# Patient Record
Sex: Male | Born: 1960 | Race: Black or African American | Hispanic: No | Marital: Married | Smoking: Former smoker
Health system: Southern US, Community
[De-identification: ages and names within clinical notes are randomized; demographics above are authoritative.]

## PROBLEM LIST (undated history)

## (undated) DIAGNOSIS — K219 Gastro-esophageal reflux disease without esophagitis: Secondary | ICD-10-CM

## (undated) HISTORY — PX: HERNIA REPAIR: SHX51

---

## 2004-09-26 ENCOUNTER — Emergency Department (HOSPITAL_COMMUNITY): Admission: EM | Admit: 2004-09-26 | Discharge: 2004-09-26 | Payer: Self-pay | Admitting: Emergency Medicine

## 2009-09-29 ENCOUNTER — Emergency Department (HOSPITAL_COMMUNITY): Admission: EM | Admit: 2009-09-29 | Discharge: 2009-09-29 | Payer: Self-pay | Admitting: Emergency Medicine

## 2011-01-26 ENCOUNTER — Ambulatory Visit (HOSPITAL_COMMUNITY)
Admission: RE | Admit: 2011-01-26 | Discharge: 2011-01-26 | Disposition: A | Discharge status: Home / Self Care | Payer: Managed Care, Other (non HMO) | Source: Ambulatory Visit | Attending: Family Medicine | Admitting: Family Medicine

## 2011-01-26 ENCOUNTER — Other Ambulatory Visit (HOSPITAL_COMMUNITY): Payer: Self-pay | Admitting: *Deleted

## 2011-01-26 DIAGNOSIS — F17200 Nicotine dependence, unspecified, uncomplicated: Secondary | ICD-10-CM | POA: Insufficient documentation

## 2011-01-26 DIAGNOSIS — R590 Localized enlarged lymph nodes: Secondary | ICD-10-CM

## 2011-01-26 DIAGNOSIS — R599 Enlarged lymph nodes, unspecified: Secondary | ICD-10-CM | POA: Insufficient documentation

## 2011-03-02 LAB — URINE MICROSCOPIC-ADD ON

## 2011-03-02 LAB — URINALYSIS, ROUTINE W REFLEX MICROSCOPIC
Bilirubin Urine: NEGATIVE
Glucose, UA: NEGATIVE mg/dL
Leukocytes, UA: NEGATIVE
Nitrite: NEGATIVE
Protein, ur: NEGATIVE mg/dL
Specific Gravity, Urine: 1.025 (ref 1.005–1.030)
Urobilinogen, UA: 1 mg/dL (ref 0.0–1.0)
pH: 6 (ref 5.0–8.0)

## 2011-03-02 LAB — GC/CHLAMYDIA PROBE AMP, GENITAL
Chlamydia, DNA Probe: NEGATIVE
GC Probe Amp, Genital: NEGATIVE

## 2011-06-26 IMAGING — CR DG CHEST 2V
2 series · 2 of 2 positions shown · non-contrast
Comparison: None

CLINICAL DATA: Inguinal adenopathy, smoker

CHEST - 2 VIEW

[view not recorded (1 of 2)]
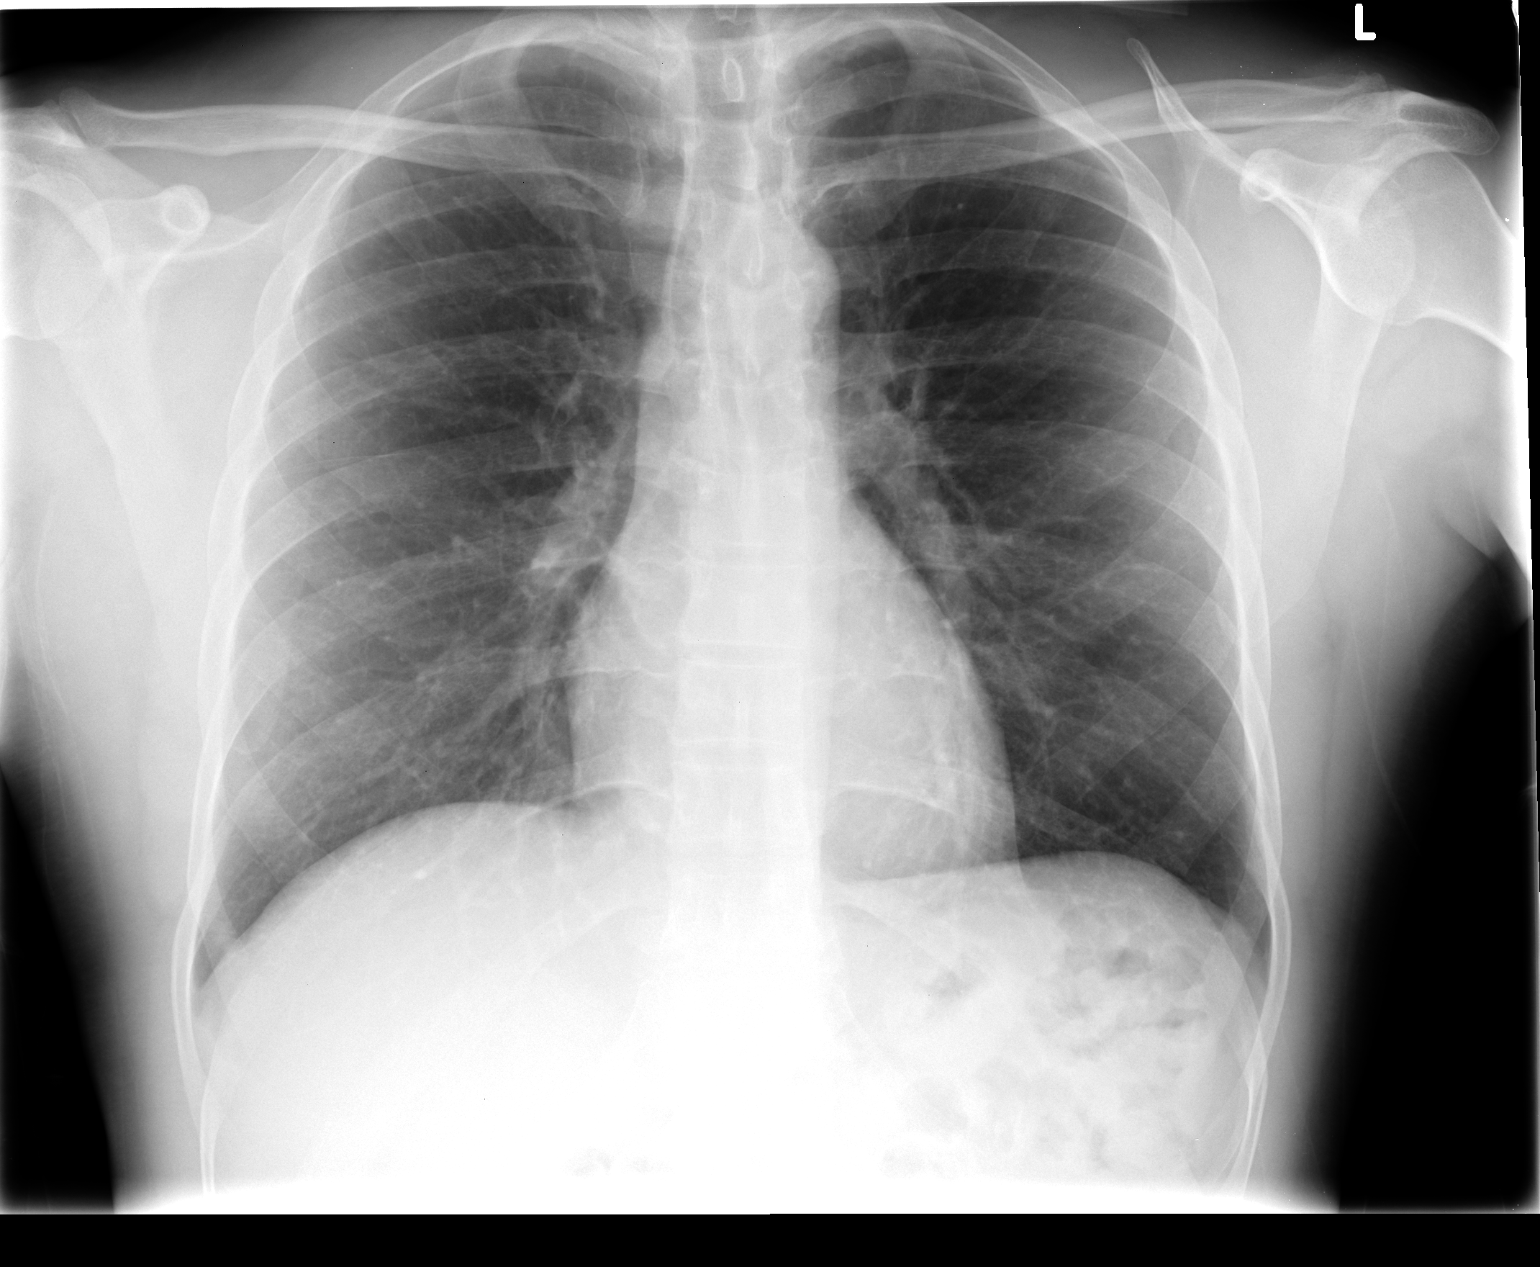

[view not recorded (2 of 2)]
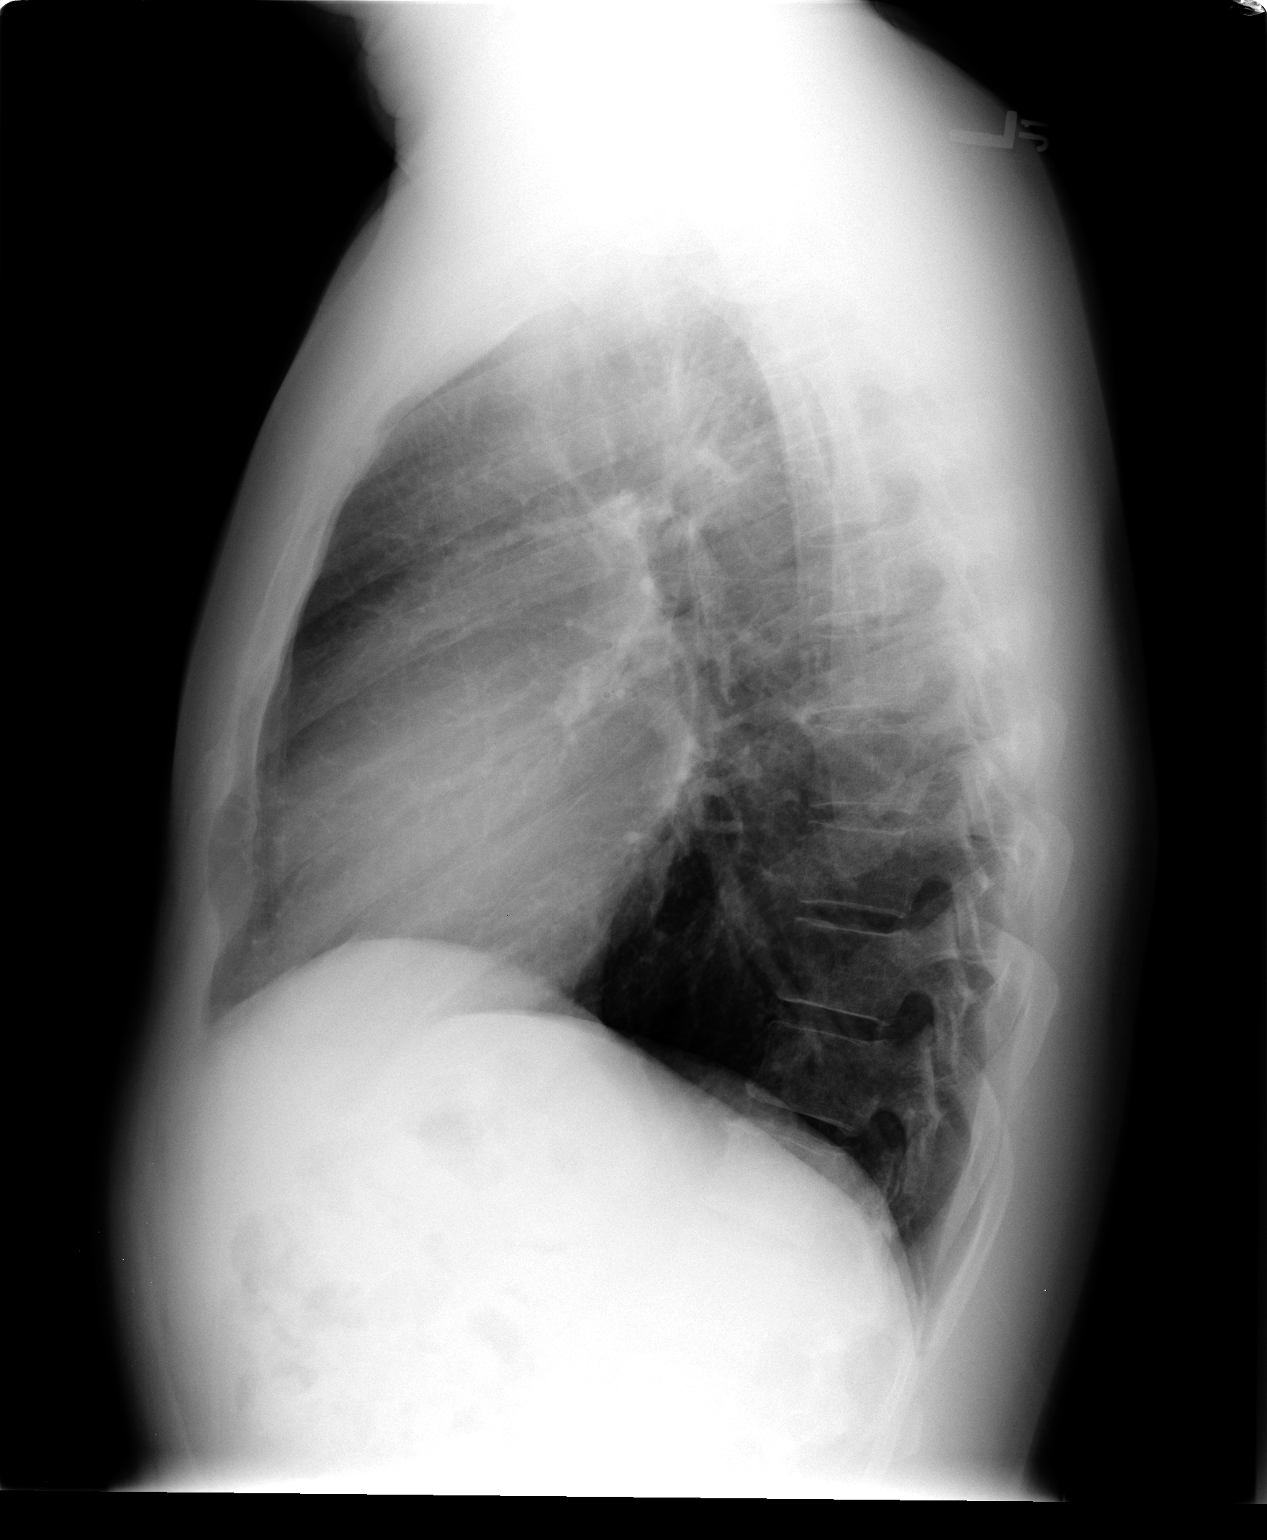

[2 of 2 positions shown; findings below may reference images not displayed]

FINDINGS: Normal heart size, mediastinal contours, and pulmonary vascularity.
Mild peribronchial thickening.
No pulmonary infiltrate, pleural effusion, or pneumothorax.
Bones unremarkable.
IMPRESSION: Mild bronchitic changes.

## 2011-07-14 ENCOUNTER — Ambulatory Visit: Payer: Managed Care, Other (non HMO) | Admitting: Family Medicine

## 2011-08-09 ENCOUNTER — Ambulatory Visit: Payer: Managed Care, Other (non HMO) | Admitting: Family Medicine

## 2011-09-05 ENCOUNTER — Ambulatory Visit (INDEPENDENT_AMBULATORY_CARE_PROVIDER_SITE_OTHER): Payer: Managed Care, Other (non HMO) | Admitting: Family Medicine

## 2011-09-05 ENCOUNTER — Encounter: Payer: Self-pay | Admitting: Family Medicine

## 2011-09-05 VITALS — BP 110/82 | HR 79 | Resp 16 | Ht 67.0 in | Wt 142.1 lb

## 2011-09-05 DIAGNOSIS — Z13220 Encounter for screening for lipoid disorders: Secondary | ICD-10-CM

## 2011-09-05 DIAGNOSIS — J069 Acute upper respiratory infection, unspecified: Secondary | ICD-10-CM

## 2011-09-05 DIAGNOSIS — Z0000 Encounter for general adult medical examination without abnormal findings: Secondary | ICD-10-CM

## 2011-09-05 DIAGNOSIS — R062 Wheezing: Secondary | ICD-10-CM

## 2011-09-05 DIAGNOSIS — Z23 Encounter for immunization: Secondary | ICD-10-CM

## 2011-09-05 DIAGNOSIS — Z125 Encounter for screening for malignant neoplasm of prostate: Secondary | ICD-10-CM

## 2011-09-05 DIAGNOSIS — Z833 Family history of diabetes mellitus: Secondary | ICD-10-CM | POA: Insufficient documentation

## 2011-09-05 LAB — POC HEMOCCULT BLD/STL (OFFICE/1-CARD/DIAGNOSTIC): Fecal Occult Blood, POC: NEGATIVE

## 2011-09-05 MED ORDER — ALBUTEROL 90 MCG/ACT IN AERS: 2.0000 | INHALATION_SPRAY | RESPIRATORY_TRACT | Status: DC | PRN

## 2011-09-05 NOTE — Assessment & Plan Note (Signed)
Viral in etiology, normal exam. Given albuterol for prn wheeze, since he was a previous smoker, obtain CXR

## 2011-09-05 NOTE — Progress Notes (Signed)
  Subjective:    Patient ID: Brandon Davies, male    DOB: 1961/05/08, 50 y.o.   MRN: [id number]  HPI - patient here to establish care and CPE, medications and history were reviewed.    Here for CPE  History of cystocopy for hematuria- 2010, negative outcome Had Colonscopy > 10 years ago, for blood in stool- negative scan, told he had gastritis and Reflux at that time, no problem since  Has enlarged sweat glands, usually occurs in summer time- I called pharmacy he was last prescribed Cleocin topical some time ago Last blood work- 1 year ago, from a physical   Tries to exercise some    Recent URI- still has cough, has wheezing at bedtime, previous smoker quit a few years ago, states when he gets a cold about once a year has some wheezing with this. He has never been evaluated for it.  No history of HTN, DM, Cancer  Review of Systems GEN- denies fatigue, fever, weight loss,weakness, recent illness HEENT- denies eye drainage, change in vision, nasal discharge, CVS- denies chest pain, palpitations RESP- denies SOB, +cough, +wheeze ABD- denies N/V, change in stools, abd pain GU- denies dysuria, hematuria, dribbling, incontinence MSK- denies joint pain, muscle aches, injury Neuro- denies headache, dizziness, syncope, seizure activity           Objective:   Physical Exam  GEN- NAD, alert and oriented x3 HEENT- PERRL, EOMI, non injected sclera, pink conjunctiva, MMM, oropharynx clear    TM clear bilat Neck- Supple, no thryomegaly CVS- RRR, no murmur RESP-CTAB, harsh cough, no retractions, no wheeze ABD-NABS, soft, NT, ND EXT- No edema Pulses- Radial, DP- 2+ GU- normal prostate size, normal rectal tone,no igunial hernia, no penile lesions Lymph- small mobile node in left groin      FOBT-neg Assessment & Plan:

## 2011-09-05 NOTE — Patient Instructions (Addendum)
I recommend a daily multivitamin Continue your exercise routine Please get the Chest x-ray for your wheezing Use the albuterol as needed every 4 hours We will call with lab results Please come fasting before labs- do not eat after midnight I recommend yearly eye visit I recommend dental visit every 6 months  F/U 4 months

## 2011-09-05 NOTE — Assessment & Plan Note (Signed)
CPE done, will check FLP, CMET, A1C with family history Encourage exercise See instructions

## 2011-09-05 NOTE — Assessment & Plan Note (Signed)
CXR, will hold on PFT unless pt has persistent wheezing episodes or has difficulty during the winter months

## 2011-09-19 LAB — COMPREHENSIVE METABOLIC PANEL
ALT: 13 U/L (ref 0–53)
AST: 17 U/L (ref 0–37)
Albumin: 4.6 g/dL (ref 3.5–5.2)
Alkaline Phosphatase: 47 U/L (ref 39–117)
BUN: 16 mg/dL (ref 6–23)
CO2: 24 mEq/L (ref 19–32)
Calcium: 9.3 mg/dL (ref 8.4–10.5)
Chloride: 105 mEq/L (ref 96–112)
Creat: 0.71 mg/dL (ref 0.50–1.35)
Glucose, Bld: 88 mg/dL (ref 70–99)
Potassium: 4.2 mEq/L (ref 3.5–5.3)
Sodium: 141 mEq/L (ref 135–145)
Total Bilirubin: 0.5 mg/dL (ref 0.3–1.2)
Total Protein: 6.8 g/dL (ref 6.0–8.3)

## 2011-09-19 LAB — PSA: PSA: 1.93 ng/mL (ref <=4.00)

## 2011-09-19 LAB — LIPID PANEL
Cholesterol: 160 mg/dL (ref 0–200)
HDL: 38 mg/dL — ABNORMAL LOW (ref >39)
LDL Cholesterol: 86 mg/dL (ref 0–99)
Total CHOL/HDL Ratio: 4.2 Ratio
Triglycerides: 179 mg/dL — ABNORMAL HIGH (ref <150)
VLDL: 36 mg/dL (ref 0–40)

## 2011-09-19 LAB — HEMOGLOBIN A1C
Hgb A1c MFr Bld: 5.7 % — ABNORMAL HIGH (ref <5.7)
Mean Plasma Glucose: 117 mg/dL — ABNORMAL HIGH (ref <117)

## 2011-09-20 ENCOUNTER — Telehealth: Payer: Self-pay | Admitting: *Deleted

## 2011-09-20 LAB — CBC WITH DIFFERENTIAL/PLATELET
Basophils Absolute: 0 10*3/uL (ref 0.0–0.1)
Basophils Relative: 0 % (ref 0–1)
Eosinophils Absolute: 0.1 10*3/uL (ref 0.0–0.7)
Eosinophils Relative: 2 % (ref 0–5)
HCT: 38.4 % — ABNORMAL LOW (ref 39.0–52.0)
Hemoglobin: 13.1 g/dL (ref 13.0–17.0)
Lymphocytes Relative: 30 % (ref 12–46)
Lymphs Abs: 2.5 10*3/uL (ref 0.7–4.0)
MCH: 32.9 pg (ref 26.0–34.0)
MCHC: 34.1 g/dL (ref 30.0–36.0)
MCV: 96.5 fL (ref 78.0–100.0)
Monocytes Absolute: 0.4 10*3/uL (ref 0.1–1.0)
Monocytes Relative: 5 % (ref 3–12)
Neutro Abs: 5.2 10*3/uL (ref 1.7–7.7)
Neutrophils Relative %: 63 % (ref 43–77)
Platelets: 198 10*3/uL (ref 150–400)
RBC: 3.98 MIL/uL — ABNORMAL LOW (ref 4.22–5.81)
RDW: 13.4 % (ref 11.5–15.5)
WBC: 8.2 10*3/uL (ref 4.0–10.5)

## 2011-09-20 NOTE — Telephone Encounter (Signed)
Message copied by Diamantina Monks on Tue Sep 20, 2011  2:02 PM ------      Message from: Milinda Antis F      Created: Tue Sep 20, 2011  8:24 AM       Please give pt lab results      1. He is not anemic      2. His cholesterol looks okay, he needs to watch the fatty acids- they are a little high. This means watch the greasy, fried foods, try grilled or baked foods      3. He does not have diabetes      4. His prostate cancer screen was normal      5. His kidney and liver function were normal      6. I would still like for him to get the CXR            In general his labs look good.

## 2011-09-20 NOTE — Telephone Encounter (Signed)
Patient aware of lab results.

## 2011-09-20 NOTE — Progress Notes (Signed)
Patient aware.

## 2011-11-30 ENCOUNTER — Encounter: Payer: Self-pay | Admitting: Family Medicine

## 2011-11-30 NOTE — Progress Notes (Signed)
Patient ID: Brandon Davies, male   DOB: 10/20/1961, 51 y.o.   MRN: [id number] Pt seen in Feb 2012 with inguinal LAD on left side, history of node on right groin treated with antibiotics, labs from that time reviewed . GC/Chlamy/ HIV neg-- ANA/ESR neg  Labs to be scanned with FLP from 1 year ago

## 2012-01-06 ENCOUNTER — Ambulatory Visit: Payer: Managed Care, Other (non HMO) | Admitting: Family Medicine

## 2012-01-13 ENCOUNTER — Ambulatory Visit: Payer: Managed Care, Other (non HMO) | Admitting: Family Medicine

## 2012-09-21 ENCOUNTER — Encounter: Payer: Managed Care, Other (non HMO) | Admitting: Family Medicine

## 2012-12-11 ENCOUNTER — Encounter: Payer: Managed Care, Other (non HMO) | Admitting: Family Medicine

## 2012-12-28 ENCOUNTER — Ambulatory Visit (INDEPENDENT_AMBULATORY_CARE_PROVIDER_SITE_OTHER): Payer: Managed Care, Other (non HMO) | Admitting: Family Medicine

## 2012-12-28 ENCOUNTER — Encounter: Payer: Self-pay | Admitting: Family Medicine

## 2012-12-28 VITALS — BP 112/64 | HR 70 | Resp 18 | Ht 67.0 in | Wt 142.1 lb

## 2012-12-28 DIAGNOSIS — Z1211 Encounter for screening for malignant neoplasm of colon: Secondary | ICD-10-CM

## 2012-12-28 DIAGNOSIS — Z833 Family history of diabetes mellitus: Secondary | ICD-10-CM

## 2012-12-28 DIAGNOSIS — Z13220 Encounter for screening for lipoid disorders: Secondary | ICD-10-CM

## 2012-12-28 DIAGNOSIS — R062 Wheezing: Secondary | ICD-10-CM

## 2012-12-28 DIAGNOSIS — Z125 Encounter for screening for malignant neoplasm of prostate: Secondary | ICD-10-CM

## 2012-12-28 DIAGNOSIS — Z0000 Encounter for general adult medical examination without abnormal findings: Secondary | ICD-10-CM

## 2012-12-28 LAB — CBC
HCT: 39.6 % (ref 39.0–52.0)
Hemoglobin: 13.6 g/dL (ref 13.0–17.0)
MCH: 32.1 pg (ref 26.0–34.0)
MCHC: 34.3 g/dL (ref 30.0–36.0)
MCV: 93.4 fL (ref 78.0–100.0)
Platelets: 168 10*3/uL (ref 150–400)
RBC: 4.24 MIL/uL (ref 4.22–5.81)
RDW: 13.7 % (ref 11.5–15.5)
WBC: 8.9 10*3/uL (ref 4.0–10.5)

## 2012-12-28 LAB — POC HEMOCCULT BLD/STL (OFFICE/1-CARD/DIAGNOSTIC): Fecal Occult Blood, POC: NEGATIVE

## 2012-12-28 LAB — COMPREHENSIVE METABOLIC PANEL
ALT: 16 U/L (ref 0–53)
AST: 22 U/L (ref 0–37)
Albumin: 4.8 g/dL (ref 3.5–5.2)
Alkaline Phosphatase: 47 U/L (ref 39–117)
BUN: 18 mg/dL (ref 6–23)
CO2: 27 mEq/L (ref 19–32)
Calcium: 9.7 mg/dL (ref 8.4–10.5)
Chloride: 107 mEq/L (ref 96–112)
Creat: 0.8 mg/dL (ref 0.50–1.35)
Glucose, Bld: 84 mg/dL (ref 70–99)
Potassium: 4.2 mEq/L (ref 3.5–5.3)
Sodium: 141 mEq/L (ref 135–145)
Total Bilirubin: 0.5 mg/dL (ref 0.3–1.2)
Total Protein: 7 g/dL (ref 6.0–8.3)

## 2012-12-28 LAB — LIPID PANEL
Cholesterol: 175 mg/dL (ref 0–200)
HDL: 43 mg/dL (ref >39)
LDL Cholesterol: 114 mg/dL — ABNORMAL HIGH (ref 0–99)
Total CHOL/HDL Ratio: 4.1 Ratio
Triglycerides: 92 mg/dL (ref <150)
VLDL: 18 mg/dL (ref 0–40)

## 2012-12-28 LAB — PSA: PSA: 0.57 ng/mL (ref <=4.00)

## 2012-12-28 MED ORDER — ALBUTEROL SULFATE HFA 108 (90 BASE) MCG/ACT IN AERS: 2.0000 | INHALATION_SPRAY | Freq: Four times a day (QID) | RESPIRATORY_TRACT | Status: DC | PRN

## 2012-12-28 NOTE — Progress Notes (Signed)
  Subjective:    Patient ID: Brandon Davies, male    DOB: 1961/07/12, 52 y.o.   MRN: [id number]  HPI Pt here for CPE, due for flu shot, colonoscopy- last seen 2012 Due for fasting labs Continues to have wheezing episodes and coughing episodes, smokes 1-2 cig at a time but not daily. No productive cough   Review of Systems  GEN- denies fatigue, fever, weight loss,weakness, recent illness HEENT- denies eye drainage, change in vision, nasal discharge, CVS- denies chest pain, palpitations RESP- denies SOB, cough, +wheeze ABD- denies N/V, change in stools, abd pain GU- denies dysuria, hematuria, dribbling, incontinence MSK- denies joint pain, muscle aches, injury Neuro- denies headache, dizziness, syncope, seizure activity      Objective:   Physical Exam GEN- NAD, alert and oriented x3 HEENT- PERRL, EOMI, non injected sclera, pink conjunctiva, MMM, oropharynx clear, TM clear bilat  Neck- Supple, no thryomegaly CVS- RRR, no murmur RESP-CTAB ABD-NABS,soft,NT,neurodermatitis GU- rectum normal external skin, normal tone, only lower half of prostate felt during exam due to discomfort- no nodules , no enlargement Neuro- CNII-XII in tact, no focal deficits  EXT- No edema Pulses- Radial, DP- 2+        Assessment & Plan:    CPE- Refer for colonoscopy, fasting labs, immunizations UTD

## 2012-12-28 NOTE — Patient Instructions (Addendum)
I recommend eye visit once a year- Call [facility] Get flu shot from pharmacy Referral for colonoscopy Pulmonary Function test to be done due to wheezing I recommend dental visit every 6 months Goal is to  Exercise 30 minutes 5 days a week We will send a letter with lab results  Albuterol as needed for wheezing You need to quit smoking- all together! F/U1 year or as needed

## 2012-12-29 LAB — HEMOGLOBIN A1C
Hgb A1c MFr Bld: 5.8 % — ABNORMAL HIGH (ref <5.7)
Mean Plasma Glucose: 120 mg/dL — ABNORMAL HIGH (ref <117)

## 2012-12-30 ENCOUNTER — Encounter: Payer: Self-pay | Admitting: Family Medicine

## 2012-12-30 NOTE — Assessment & Plan Note (Addendum)
Refer for PFT, albuterol prn

## 2013-01-16 ENCOUNTER — Encounter (HOSPITAL_COMMUNITY): Payer: Self-pay | Admitting: Pharmacy Technician

## 2013-01-16 ENCOUNTER — Other Ambulatory Visit: Payer: Self-pay

## 2013-01-16 ENCOUNTER — Telehealth: Payer: Self-pay

## 2013-01-16 DIAGNOSIS — Z1211 Encounter for screening for malignant neoplasm of colon: Secondary | ICD-10-CM

## 2013-01-16 NOTE — Telephone Encounter (Signed)
Gastroenterology Pre-Procedure Form    Request Date: 01/25/2013    Requesting Physician: Dr. Jeanice Lim     PATIENT INFORMATION:  Brandon Davies is a 52 y.o., male (DOB=1961-06-19).  PROCEDURE: Procedure(s) requested: colonoscopy Procedure Reason: screening for colon cancer  PATIENT REVIEW QUESTIONS: The patient reports the following:   1. Diabetes Melitis: no 2. Joint replacements in the past 12 months: no 3. Major health problems in the past 3 months: no 4. Has an artificial valve or MVP:no 5. Has been advised in past to take antibiotics in advance of a procedure like teeth cleaning: no}    MEDICATIONS & ALLERGIES:    Patient reports the following regarding taking any blood thinners:   Plavix? no Aspirin?no Coumadin?  no  Patient confirms/reports the following medications:  Current Outpatient Prescriptions  Medication Sig Dispense Refill  . albuterol (PROVENTIL HFA;VENTOLIN HFA) 108 (90 BASE) MCG/ACT inhaler Inhale 2 puffs into the lungs every 6 (six) hours as needed for wheezing.  1 Inhaler  2   No current facility-administered medications for this visit.    Patient confirms/reports the following allergies:  Allergies  Allergen Reactions  . Penicillins     Patient is appropriate to schedule for requested procedure(s): yes  AUTHORIZATION INFORMATION Primary Insurance:   ID #: Group #:  Pre-Cert / Auth required:  Pre-Cert / Auth #:   Secondary Insurance:   ID #: Group #:  Pre-Cert / Auth required: Pre-Cert / Auth #:   No orders of the defined types were placed in this encounter.    SCHEDULE INFORMATION: Procedure has been scheduled as follows:  Date: 01/25/2013    Time: 8:30 AM  Location: [facility] Short Stay  This Gastroenterology Pre-Precedure Form is being routed to the following provider(s) for review: R. Roetta Sessions, MD

## 2013-01-16 NOTE — Telephone Encounter (Signed)
Appropriate for colonoscopy.  

## 2013-01-17 ENCOUNTER — Telehealth: Payer: Self-pay | Admitting: Family Medicine

## 2013-01-17 MED ORDER — PEG-KCL-NACL-NASULF-NA ASC-C 100 G PO SOLR: 1.0000 | ORAL | Status: DC

## 2013-01-17 NOTE — Telephone Encounter (Signed)
Rx sent to [facility] in [place]. Instructions mailed to pt.

## 2013-01-17 NOTE — Telephone Encounter (Signed)
Patient is aware 

## 2013-01-18 ENCOUNTER — Telehealth: Payer: Self-pay

## 2013-01-18 NOTE — Telephone Encounter (Signed)
I called [employer] at [phone]  And spoke to [facility], who said a PA is not required for screening colonoscopy.

## 2013-01-21 ENCOUNTER — Telehealth: Payer: Self-pay | Admitting: Internal Medicine

## 2013-01-21 NOTE — Telephone Encounter (Signed)
I called pt and rescheduled for 3/21/214 at 8:30. Selena Batten is aware and I have mailed new instructions to pt.

## 2013-01-21 NOTE — Telephone Encounter (Signed)
Pt called to cancel tcs for this Friday and wants to [facility] procedure for either 3/14 or 3/21 please call him at [phone] or he will call back before 5 today

## 2013-01-24 ENCOUNTER — Encounter (HOSPITAL_COMMUNITY): Payer: Managed Care, Other (non HMO)

## 2013-01-30 ENCOUNTER — Ambulatory Visit (HOSPITAL_COMMUNITY)
Admission: RE | Admit: 2013-01-30 | Discharge: 2013-01-30 | Disposition: A | Discharge status: Home / Self Care | Payer: Managed Care, Other (non HMO) | Source: Ambulatory Visit | Attending: Family Medicine | Admitting: Family Medicine

## 2013-01-30 DIAGNOSIS — R062 Wheezing: Secondary | ICD-10-CM | POA: Insufficient documentation

## 2013-02-05 LAB — PULMONARY FUNCTION TEST

## 2013-02-06 NOTE — Procedures (Signed)
NAME:  Brandon Davies, WHITERS                     ACCOUNT NO.:  MEDICAL RECORD NO.:  [id number]  LOCATION:                                 FACILITY:  PHYSICIAN:  Edward L. Juanetta Gosling, M.D.DATE OF BIRTH:  04-09-1961  DATE OF PROCEDURE: DATE OF DISCHARGE:                           PULMONARY FUNCTION TEST   REASON FOR PULMONARY FUNCTION TESTING:  Wheezing.  1. Spirometry is normal. 2. Lung volumes are normal. 3. DLCO is mildly reduced, but does correct with volume. 4. Airway resistance is somewhat high. 5. This study shows minimal if any airflow obstruction.  Wheezing could be related to asthma or due to exposure to inhalant toxins.  Please note that the diffusing capacity shows __________ error, which may indicate that diffusing capacity is better than indicating.     Edward L. Juanetta Gosling, M.D.     ELH/MEDQ  D:  02/04/2013  T:  02/05/2013  Job:  [id number]

## 2013-02-08 NOTE — Progress Notes (Signed)
Left message to let patient know to call back and schedule an appointment

## 2013-02-12 ENCOUNTER — Telehealth: Payer: Self-pay

## 2013-02-12 NOTE — Telephone Encounter (Signed)
We have not heard back from pt yet, so Selena Batten said she is taking completely off schedule and I will have to do new orders when he reschedules.

## 2013-02-12 NOTE — Telephone Encounter (Signed)
VM from someone for pt. Brandon Davies he would like to reschedule his appt for colonoscopy that is scheduled for 8:30 AM on 02/15/2013. I called at the number that was given, [phone] and left message that I have taken him off of the schedule and to please return call to reschedule. ( Will need to update triage also). Selena Batten is aware he is not coming on 02/15/2013.

## 2013-02-14 ENCOUNTER — Other Ambulatory Visit: Payer: Self-pay

## 2013-02-14 DIAGNOSIS — Z1211 Encounter for screening for malignant neoplasm of colon: Secondary | ICD-10-CM

## 2013-02-14 NOTE — Telephone Encounter (Signed)
Pt is rescheduled for 03/22/2013 at 7:30 and to be at Short Stay at 6:30 AM. New instructions mailed to pt and prescription previously sent to [facility]. Placed on my call list to update triage prior to procedure.

## 2013-02-15 ENCOUNTER — Ambulatory Visit (HOSPITAL_COMMUNITY)
Admission: RE | Admit: 2013-02-15 | Payer: Managed Care, Other (non HMO) | Source: Ambulatory Visit | Admitting: Internal Medicine

## 2013-02-15 ENCOUNTER — Encounter (HOSPITAL_COMMUNITY): Admission: RE | Payer: Self-pay | Source: Ambulatory Visit

## 2013-02-15 SURGERY — COLONOSCOPY
Anesthesia: Moderate Sedation

## 2013-02-18 ENCOUNTER — Ambulatory Visit (INDEPENDENT_AMBULATORY_CARE_PROVIDER_SITE_OTHER): Payer: Managed Care, Other (non HMO) | Admitting: Family Medicine

## 2013-02-18 ENCOUNTER — Encounter: Payer: Self-pay | Admitting: Family Medicine

## 2013-02-18 VITALS — BP 118/78 | HR 72 | Resp 18 | Ht 67.0 in | Wt 142.1 lb

## 2013-02-18 DIAGNOSIS — J45909 Unspecified asthma, uncomplicated: Secondary | ICD-10-CM

## 2013-02-18 DIAGNOSIS — F17200 Nicotine dependence, unspecified, uncomplicated: Secondary | ICD-10-CM

## 2013-02-18 NOTE — Progress Notes (Signed)
  Subjective:    Patient ID: Brandon Davies, male    DOB: 01/05/1961, 52 y.o.   MRN: [id number]  HPI  Patient here to followup PFT results. He continues to have a few episodes of wheezing states mostly when he is out in the cooler weather and he smokes. His initial PFT screening was read as mild COPD however this override by pulmonology suggest that he has some abnormalities with his PFTs however her this is not true COPD and smoking cessation we'll keep him from developing COPD. He works at [employer] and currently smokes about 2 cigarettes at a time when he does.   Review of Systems - per above  GEN- denies fatigue, fever, weight loss,weakness, recent illness HEENT- denies eye drainage, change in vision, nasal discharge, CVS- denies chest pain, palpitations RESP- denies SOB, cough, wheeze         Objective:   Physical Exam  GEN- NAD, alert and oriented x3 CVS- RRR, no murmur RESP-CTAB EXT- No edema Pulses- Radial 2+       Assessment & Plan:

## 2013-02-18 NOTE — Patient Instructions (Signed)
You need to quit smoking!  Try exercises Try a patch if needed low dose Electronic cigarette

## 2013-02-19 DIAGNOSIS — J45909 Unspecified asthma, uncomplicated: Secondary | ICD-10-CM | POA: Insufficient documentation

## 2013-02-19 DIAGNOSIS — F17200 Nicotine dependence, unspecified, uncomplicated: Secondary | ICD-10-CM | POA: Insufficient documentation

## 2013-02-19 NOTE — Assessment & Plan Note (Signed)
Albuterol as needed , no diagnosis of COPD/Emphysema at this time

## 2013-02-19 NOTE — Assessment & Plan Note (Signed)
Discussed importance of tobacco cessation with him. 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 He does some he can get the electronic cigarette from his company as well for free. I do not think he is quite mentally ready to quit he was given a handout on COPD and tobacco cessation

## 2013-02-26 ENCOUNTER — Telehealth: Payer: Self-pay

## 2013-02-26 NOTE — Telephone Encounter (Signed)
Agree 

## 2013-02-26 NOTE — Telephone Encounter (Signed)
I called pt to update triage. He has not had any new medical problems and no change in medications. He is scheduled with RMR on 03/22/2013 at 7:30 Am for screening colonoscopy.

## 2013-03-08 ENCOUNTER — Encounter (HOSPITAL_COMMUNITY): Payer: Self-pay | Admitting: Pharmacy Technician

## 2013-03-21 MED ORDER — SODIUM CHLORIDE 0.45 % IV SOLN: INTRAVENOUS | Status: DC

## 2013-03-22 ENCOUNTER — Ambulatory Visit (HOSPITAL_COMMUNITY)
Admission: RE | Admit: 2013-03-22 | Discharge: 2013-03-22 | Disposition: A | Discharge status: Home / Self Care | Payer: Managed Care, Other (non HMO) | Source: Ambulatory Visit | Attending: Internal Medicine | Admitting: Internal Medicine

## 2013-03-22 ENCOUNTER — Encounter (HOSPITAL_COMMUNITY): Payer: Self-pay | Admitting: *Deleted

## 2013-03-22 ENCOUNTER — Telehealth: Payer: Self-pay

## 2013-03-22 ENCOUNTER — Encounter (HOSPITAL_COMMUNITY)
Admission: RE | Disposition: A | Discharge status: Home / Self Care | Payer: Self-pay | Source: Ambulatory Visit | Attending: Internal Medicine

## 2013-03-22 DIAGNOSIS — K5730 Diverticulosis of large intestine without perforation or abscess without bleeding: Secondary | ICD-10-CM

## 2013-03-22 DIAGNOSIS — Z1211 Encounter for screening for malignant neoplasm of colon: Secondary | ICD-10-CM | POA: Insufficient documentation

## 2013-03-22 HISTORY — PX: COLONOSCOPY: SHX5424

## 2013-03-22 HISTORY — DX: Gastro-esophageal reflux disease without esophagitis: K21.9

## 2013-03-22 SURGERY — COLONOSCOPY
Anesthesia: Moderate Sedation

## 2013-03-22 MED ORDER — STERILE WATER FOR IRRIGATION IR SOLN
Status: DC | PRN
  Administered 2013-03-22: 07:00:00

## 2013-03-22 MED ORDER — MIDAZOLAM HCL 5 MG/5ML IJ SOLN
INTRAMUSCULAR | Status: AC
  Filled 2013-03-22: qty 10

## 2013-03-22 MED ORDER — MEPERIDINE HCL 100 MG/ML IJ SOLN
INTRAMUSCULAR | Status: DC | PRN
  Administered 2013-03-22: 50 mg via INTRAVENOUS
  Administered 2013-03-22: 25 mg via INTRAVENOUS
  Administered 2013-03-22: 50 mg via INTRAVENOUS

## 2013-03-22 MED ORDER — SODIUM CHLORIDE 0.9 % IV SOLN
INTRAVENOUS | Status: DC
  Administered 2013-03-22: 1000 mL via INTRAVENOUS

## 2013-03-22 MED ORDER — ONDANSETRON HCL 4 MG/2ML IJ SOLN
INTRAMUSCULAR | Status: AC
  Filled 2013-03-22: qty 2

## 2013-03-22 MED ORDER — ONDANSETRON HCL 4 MG/2ML IJ SOLN
INTRAMUSCULAR | Status: DC | PRN
  Administered 2013-03-22: 4 mg via INTRAVENOUS

## 2013-03-22 MED ORDER — MEPERIDINE HCL 100 MG/ML IJ SOLN
INTRAMUSCULAR | Status: AC
  Filled 2013-03-22: qty 2

## 2013-03-22 MED ORDER — MIDAZOLAM HCL 5 MG/5ML IJ SOLN
INTRAMUSCULAR | Status: DC | PRN
  Administered 2013-03-22: 2 mg via INTRAVENOUS
  Administered 2013-03-22 (×2): 1 mg via INTRAVENOUS
  Administered 2013-03-22: 2 mg via INTRAVENOUS
  Administered 2013-03-22: 1 mg via INTRAVENOUS

## 2013-03-22 NOTE — H&P (Signed)
  Primary Care Physician:  Milinda Antis, MD Primary Gastroenterologist:  Dr. Jena Gauss  Pre-Procedure History & Physical: HPI:  Brandon Davies is a 52 y.o. male is here for a screening colonoscopy. No bowel symptoms. No Family history colon cancer or colon polyps. Had a colonoscopy 15 years for Hemoccult-positive stool-reportedly negative.    Past Medical History  Diagnosis Date  . GERD (gastroesophageal reflux disease)     Past Surgical History  Procedure Laterality Date  . Hernia repair      inguinal    Prior to Admission medications   Medication Sig Start Date End Date Taking? Authorizing Provider  peg 3350 powder (MOVIPREP) 100 G SOLR Take 1 kit (100 g total) by mouth as directed. 01/17/13  Yes Corbin Ade, MD  albuterol (PROVENTIL HFA;VENTOLIN HFA) 108 (90 BASE) MCG/ACT inhaler Inhale 2 puffs into the lungs every 6 (six) hours as needed for wheezing. 12/28/12   Salley Scarlet, MD    Allergies as of 02/14/2013 - Review Complete 01/16/2013  Allergen Reaction Noted  . Penicillins  01/26/2011    Family History  Problem Relation Age of Onset  . Hypertension Mother   . Diabetes Father   . Cancer Sister   . Mental illness Sister   . Hypertension Brother     History   Social History  . Marital Status: Married    Spouse Name: N/A    Number of Children: N/A  . Years of Education: N/A   Occupational History  . Not on file.   Social History Main Topics  . Smoking status: Former Smoker -- 0.10 packs/day for 7 years    Types: Cigarettes  . Smokeless tobacco: Not on file  . Alcohol Use: Yes     Comment: occasionally  . Drug Use: No  . Sexually Active: Not on file   Other Topics Concern  . Not on file   Social History Narrative  . No narrative on file    Review of Systems: See HPI, otherwise negative ROS  Physical Exam: BP 121/87  Pulse 72  Temp(Src) 97.7 F (36.5 C) (Oral)  Resp 18  Ht 5\' 8"  (1.727 m)  Wt 145 lb (65.772 kg)  BMI 22.05 kg/m2  SpO2  97% General:   Alert,  Well-developed, well-nourished, pleasant and cooperative in NAD. Multiple tattoos. Head:  Normocephalic and atraumatic. Eyes:  Sclera clear, no icterus.   Conjunctiva pink. Ears:  Normal auditory acuity. Nose:  No deformity, discharge,  or lesions. Mouth:  No deformity or lesions, dentition normal. Neck:  Supple; no masses or thyromegaly. Lungs:  Clear throughout to auscultation.   No wheezes, crackles, or rhonchi. No acute distress. Heart:  Regular rate and rhythm; no murmurs, clicks, rubs,  or gallops. Abdomen:  Soft, nontender and nondistended. No masses, hepatosplenomegaly or hernias noted. Normal bowel sounds, without guarding, and without rebound.   Msk:  Symmetrical without gross deformities. Normal posture. Pulses:  Normal pulses noted. Extremities:  Without clubbing or edema. Neurologic:  Alert and  oriented x4;  grossly normal neurologically. Skin:  Intact without significant lesions or rashes. Cervical Nodes:  No significant cervical adenopathy. Psych:  Alert and cooperative. Normal mood and affect.  Impression/Plan: Brandon Davies is now here to undergo a screening colonoscopy.  Average risk screening examination.  Risks, benefits, limitations, imponderables and alternatives regarding colonoscopy have been reviewed with the patient. Questions have been answered. All parties agreeable.

## 2013-03-22 NOTE — Telephone Encounter (Signed)
Pt's wife called and said he had his colonoscopy this morning. He needs work note for yesterday and today. York Spaniel he was unable to stay at work on Thursday for having to go to the bathroom so much. Per Tana Coast, PA , ok to give note. Note at front for pick up and pt is aware. York Spaniel it will be Monday before they pick it up.

## 2013-03-22 NOTE — Op Note (Signed)
[facility] [address] [place], [address]   COLONOSCOPY PROCEDURE REPORT  PATIENT: Brandon Davies, Brandon Davies  MR#:         [id number] BIRTHDATE: 1961/07/24 , 51  yrs. old GENDER: Male ENDOSCOPIST: R.  Roetta Sessions, MD FACP FACG REFERRED BY:  Milinda Antis, M.D. PROCEDURE DATE:  03/22/2013 PROCEDURE:     screening ileocolonoscopy  INDICATIONS: Average risk colorectal cancer screening  INFORMED CONSENT:  The risks, benefits, alternatives and imponderables including but not limited to bleeding, perforation as well as the possibility of a missed lesion have been reviewed.  The potential for biopsy, lesion removal, etc. have also been discussed.  Questions have been answered.  All parties agreeable. Please see the history and physical in the medical record for more information.  MEDICATIONS: Versed 7 mg IV and Demerol 125 mg IV in divided doses. Zofran 4 mg IV  DESCRIPTION OF PROCEDURE:  After a digital rectal exam was performed, the EC-3890Li ([id number])  colonoscope was advanced from the anus through the rectum and colon to the area of the cecum, ileocecal valve and appendiceal orifice.  The cecum was deeply intubated.  These structures were well-seen and photographed for the record.  From the level of the cecum and ileocecal valve, the scope was slowly and cautiously withdrawn.  The mucosal surfaces were carefully surveyed utilizing scope tip deflection to facilitate fold flattening as needed.  The scope was pulled down into the rectum where a thorough examination including retroflexion was performed.    FINDINGS:  Adequate preparation. Normal rectum. Scattered left-sided transverse diverticula; otherwise, the remainder of the colonic mucosa appeared normal. The distal 10 cm of terminal ileum mucosa also appeared normal.  THERAPEUTIC / DIAGNOSTIC MANEUVERS PERFORMED:  None  COMPLICATIONS: None  CECAL WITHDRAWAL TIME:  10 minute  IMPRESSION:  Colonic  diverticulosis  RECOMMENDATIONS:   Repeat screening colonoscopy in 10 years   _______________________________ eSigned:  R. Roetta Sessions, MD FACP [facility] 03/22/2013 8:37 AM   CC:    PATIENT NAME:  Brandon Davies, Brandon Davies MR#: [id number]

## 2013-03-26 ENCOUNTER — Encounter (HOSPITAL_COMMUNITY): Payer: Self-pay | Admitting: Internal Medicine

## 2013-05-23 ENCOUNTER — Emergency Department (HOSPITAL_COMMUNITY): Payer: Managed Care, Other (non HMO)

## 2013-05-23 ENCOUNTER — Encounter (HOSPITAL_COMMUNITY): Payer: Self-pay | Admitting: Emergency Medicine

## 2013-05-23 ENCOUNTER — Emergency Department (HOSPITAL_COMMUNITY)
Admission: EM | Admit: 2013-05-23 | Discharge: 2013-05-23 | Disposition: A | Discharge status: Home / Self Care | Payer: Managed Care, Other (non HMO) | Attending: Emergency Medicine | Admitting: Emergency Medicine

## 2013-05-23 DIAGNOSIS — Y92410 Unspecified street and highway as the place of occurrence of the external cause: Secondary | ICD-10-CM | POA: Insufficient documentation

## 2013-05-23 DIAGNOSIS — Y939 Activity, unspecified: Secondary | ICD-10-CM | POA: Insufficient documentation

## 2013-05-23 DIAGNOSIS — S40012A Contusion of left shoulder, initial encounter: Secondary | ICD-10-CM

## 2013-05-23 DIAGNOSIS — S161XXA Strain of muscle, fascia and tendon at neck level, initial encounter: Secondary | ICD-10-CM

## 2013-05-23 DIAGNOSIS — S20219A Contusion of unspecified front wall of thorax, initial encounter: Secondary | ICD-10-CM | POA: Insufficient documentation

## 2013-05-23 DIAGNOSIS — S20211A Contusion of right front wall of thorax, initial encounter: Secondary | ICD-10-CM

## 2013-05-23 DIAGNOSIS — S40019A Contusion of unspecified shoulder, initial encounter: Secondary | ICD-10-CM | POA: Insufficient documentation

## 2013-05-23 DIAGNOSIS — S39012A Strain of muscle, fascia and tendon of lower back, initial encounter: Secondary | ICD-10-CM

## 2013-05-23 DIAGNOSIS — Z8719 Personal history of other diseases of the digestive system: Secondary | ICD-10-CM | POA: Insufficient documentation

## 2013-05-23 DIAGNOSIS — Z880 Allergy status to penicillin: Secondary | ICD-10-CM | POA: Insufficient documentation

## 2013-05-23 DIAGNOSIS — S335XXA Sprain of ligaments of lumbar spine, initial encounter: Secondary | ICD-10-CM | POA: Insufficient documentation

## 2013-05-23 DIAGNOSIS — Z79899 Other long term (current) drug therapy: Secondary | ICD-10-CM | POA: Insufficient documentation

## 2013-05-23 DIAGNOSIS — Z87891 Personal history of nicotine dependence: Secondary | ICD-10-CM | POA: Insufficient documentation

## 2013-05-23 DIAGNOSIS — S139XXA Sprain of joints and ligaments of unspecified parts of neck, initial encounter: Secondary | ICD-10-CM | POA: Insufficient documentation

## 2013-05-23 LAB — URINALYSIS, ROUTINE W REFLEX MICROSCOPIC
Ketones, ur: NEGATIVE mg/dL
Protein, ur: NEGATIVE mg/dL
Urobilinogen, UA: 0.2 mg/dL (ref 0.0–1.0)

## 2013-05-23 LAB — URINE MICROSCOPIC-ADD ON

## 2013-05-23 MED ORDER — IBUPROFEN 800 MG PO TABS
800.0000 mg | ORAL_TABLET | Freq: Once | ORAL | Status: AC
  Administered 2013-05-23: 800 mg via ORAL
  Filled 2013-05-23: qty 1

## 2013-05-23 MED ORDER — OXYCODONE-ACETAMINOPHEN 5-325 MG PO TABS: 1.0000 | ORAL_TABLET | ORAL | Status: DC | PRN

## 2013-05-23 MED ORDER — NAPROXEN 500 MG PO TABS: 500.0000 mg | ORAL_TABLET | Freq: Two times a day (BID) | ORAL | Status: DC

## 2013-05-23 NOTE — ED Notes (Signed)
Reminded patient we still need a urine specimen - cannot go at this time

## 2013-05-23 NOTE — ED Notes (Signed)
Ambulatory to bathroom - urine specimen obtained
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
## 2013-06-25 ENCOUNTER — Encounter: Payer: Self-pay | Admitting: Family Medicine

## 2013-06-25 ENCOUNTER — Ambulatory Visit (INDEPENDENT_AMBULATORY_CARE_PROVIDER_SITE_OTHER): Payer: Managed Care, Other (non HMO) | Admitting: Family Medicine

## 2013-06-25 ENCOUNTER — Ambulatory Visit: Payer: Self-pay | Admitting: Family Medicine

## 2013-06-25 DIAGNOSIS — S161XXD Strain of muscle, fascia and tendon at neck level, subsequent encounter: Secondary | ICD-10-CM

## 2013-06-25 DIAGNOSIS — M549 Dorsalgia, unspecified: Secondary | ICD-10-CM | POA: Insufficient documentation

## 2013-06-25 DIAGNOSIS — S161XXA Strain of muscle, fascia and tendon at neck level, initial encounter: Secondary | ICD-10-CM | POA: Insufficient documentation

## 2013-06-25 DIAGNOSIS — M542 Cervicalgia: Secondary | ICD-10-CM

## 2013-06-25 DIAGNOSIS — Z5189 Encounter for other specified aftercare: Secondary | ICD-10-CM

## 2013-06-25 MED ORDER — OXYCODONE-ACETAMINOPHEN 7.5-325 MG PO TABS: 1.0000 | ORAL_TABLET | Freq: Four times a day (QID) | ORAL | Status: DC | PRN

## 2013-06-25 MED ORDER — NAPROXEN 500 MG PO TABS: 500.0000 mg | ORAL_TABLET | Freq: Two times a day (BID) | ORAL | Status: DC

## 2013-06-25 MED ORDER — CYCLOBENZAPRINE HCL 10 MG PO TABS: 10.0000 mg | ORAL_TABLET | Freq: Three times a day (TID) | ORAL | Status: DC | PRN

## 2013-06-25 NOTE — Patient Instructions (Signed)
Start the anti-inflammatory twice a day  Percocet for pain Muscle relaxer as needed  Records release for Dr. Ladona Ridgel Chiropracter
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
## 2013-06-25 NOTE — Assessment & Plan Note (Signed)
Will restart anti-inflammatories, Percocet and muscle relaxants given. He can followup with chiropractor His neck pain is already improving

## 2013-06-25 NOTE — Assessment & Plan Note (Signed)
Lumbar pain , he also has significant spasm. There is some concern about any radicular symptoms even though his x-ray does not show any bulging disc. He may require an MRI if he does not improve. I'll have him complete 4 weeks of anti-inflammatories, he was also started on muscle relaxants a refill on Percocet I did increase the dose to 7.5 mg.

## 2013-07-22 ENCOUNTER — Telehealth: Payer: Self-pay | Admitting: Family Medicine

## 2013-07-22 NOTE — Telephone Encounter (Signed)
Yes, okay to refill, must come in to pick

## 2013-07-22 NOTE — Telephone Encounter (Signed)
?   OK to Refill  

## 2013-07-23 MED ORDER — OXYCODONE-ACETAMINOPHEN 7.5-325 MG PO TABS: 1.0000 | ORAL_TABLET | Freq: Four times a day (QID) | ORAL | Status: DC | PRN

## 2013-07-23 NOTE — Telephone Encounter (Signed)
RX printed, left up front and patient aware to pick up  

## 2014-05-13 ENCOUNTER — Encounter: Payer: Self-pay | Admitting: Family Medicine

## 2014-05-13 ENCOUNTER — Ambulatory Visit (INDEPENDENT_AMBULATORY_CARE_PROVIDER_SITE_OTHER): Payer: Managed Care, Other (non HMO) | Admitting: Family Medicine

## 2014-05-13 VITALS — BP 104/62 | HR 78 | Temp 98.0°F | Resp 16 | Ht 65.0 in | Wt 146.0 lb

## 2014-05-13 DIAGNOSIS — Z23 Encounter for immunization: Secondary | ICD-10-CM

## 2014-05-13 DIAGNOSIS — F17200 Nicotine dependence, unspecified, uncomplicated: Secondary | ICD-10-CM

## 2014-05-13 DIAGNOSIS — Z125 Encounter for screening for malignant neoplasm of prostate: Secondary | ICD-10-CM

## 2014-05-13 DIAGNOSIS — Z0000 Encounter for general adult medical examination without abnormal findings: Secondary | ICD-10-CM

## 2014-05-13 LAB — COMPREHENSIVE METABOLIC PANEL
ALT: 12 U/L (ref 0–53)
AST: 17 U/L (ref 0–37)
Albumin: 4.6 g/dL (ref 3.5–5.2)
Alkaline Phosphatase: 47 U/L (ref 39–117)
BILIRUBIN TOTAL: 0.7 mg/dL (ref 0.2–1.2)
BUN: 13 mg/dL (ref 6–23)
CO2: 26 meq/L (ref 19–32)
Calcium: 9.6 mg/dL (ref 8.4–10.5)
Chloride: 107 mEq/L (ref 96–112)
Creat: 0.76 mg/dL (ref 0.50–1.35)
GLUCOSE: 88 mg/dL (ref 70–99)
Potassium: 3.8 mEq/L (ref 3.5–5.3)
SODIUM: 142 meq/L (ref 135–145)
TOTAL PROTEIN: 6.6 g/dL (ref 6.0–8.3)

## 2014-05-13 LAB — CBC WITH DIFFERENTIAL/PLATELET
Basophils Absolute: 0 10*3/uL (ref 0.0–0.1)
Basophils Relative: 0 % (ref 0–1)
EOS ABS: 0.1 10*3/uL (ref 0.0–0.7)
EOS PCT: 1 % (ref 0–5)
HEMATOCRIT: 38 % — AB (ref 39.0–52.0)
HEMOGLOBIN: 13.1 g/dL (ref 13.0–17.0)
LYMPHS ABS: 2.1 10*3/uL (ref 0.7–4.0)
Lymphocytes Relative: 25 % (ref 12–46)
MCH: 32.3 pg (ref 26.0–34.0)
MCHC: 34.5 g/dL (ref 30.0–36.0)
MCV: 93.6 fL (ref 78.0–100.0)
MONO ABS: 0.5 10*3/uL (ref 0.1–1.0)
MONOS PCT: 6 % (ref 3–12)
NEUTROS PCT: 68 % (ref 43–77)
Neutro Abs: 5.7 10*3/uL (ref 1.7–7.7)
Platelets: 198 10*3/uL (ref 150–400)
RBC: 4.06 MIL/uL — AB (ref 4.22–5.81)
RDW: 13.9 % (ref 11.5–15.5)
WBC: 8.4 10*3/uL (ref 4.0–10.5)

## 2014-05-13 LAB — LIPID PANEL
CHOLESTEROL: 159 mg/dL (ref 0–200)
HDL: 38 mg/dL — ABNORMAL LOW (ref >39)
LDL Cholesterol: 88 mg/dL (ref 0–99)
TRIGLYCERIDES: 163 mg/dL — AB (ref <150)
Total CHOL/HDL Ratio: 4.2 Ratio
VLDL: 33 mg/dL (ref 0–40)

## 2014-05-13 NOTE — Assessment & Plan Note (Signed)
CPE done, immunizations given Fasting labs MVI

## 2014-05-13 NOTE — Patient Instructions (Signed)
I recommend eye visit once a year I recommend dental visit every 6 months Goal is to  Exercise 30 minutes 5 days a week We will send a letter with lab results  F/u 1 YEAR OR AS needed

## 2014-05-13 NOTE — Assessment & Plan Note (Signed)
Counseled on tobacco cessation  As he is a smoker and has had some reactive airway disease in the past I will go ahead and get the Pneumovax 23

## 2014-05-13 NOTE — Progress Notes (Signed)
Patient ID: Brandon Davies, male   DOB: 03/14/1961, 53 y.o.   MRN: [id number]   Subjective:    Patient ID: Brandon Davies, male    DOB: 10/30/1961, 53 y.o.   MRN: [id number]  Patient presents for CPE  patient here for complete physical he has no specific concerns. He does not take any medications. He plans to restart his vitamin. He tries to stay active. He is going to establish with a new dentist. Colonoscopy is up-to-date tetanus booster is up-to-date. He does smoke and inquired about a pneumonia shot.    Review Of Systems:  GEN- denies fatigue, fever, weight loss,weakness, recent illness HEENT- denies eye drainage, change in vision, nasal discharge, CVS- denies chest pain, palpitations RESP- denies SOB, cough, wheeze ABD- denies N/V, change in stools, abd pain GU- denies dysuria, hematuria, dribbling, incontinence MSK- denies joint pain, muscle aches, injury Neuro- denies headache, dizziness, syncope, seizure activity       Objective:    BP 104/62  Pulse 78  Temp(Src) 98 F (36.7 C) (Oral)  Resp 16  Ht 5\' 5"  (1.651 m)  Wt 146 lb (66.225 kg)  BMI 24.30 kg/m2 GEN- NAD, alert and oriented x3 HEENT- PERRL, EOMI, non injected sclera, pink conjunctiva, MMM, oropharynx clear, TM clear  Neck- Supple,  CVS- RRR, no murmur RESP-CTAB ABD-NABS,soft,NT,ND Rectal- Declines EXT- No edema MSK- FROM upper and Lower EXT Pulses- Radial, DP- 2+        Assessment & Plan:      Problem List Items Addressed This Visit   None    Visit Diagnoses   Routine general medical examination at a health care facility    -  Primary    Relevant Orders       CBC with Differential       Comprehensive metabolic panel       Lipid panel    Prostate cancer screening        Relevant Orders       PSA       Note: This dictation was prepared with Dragon dictation along with smaller phrase technology. Any transcriptional errors that result from this process are unintentional.

## 2014-05-13 NOTE — Addendum Note (Signed)
Addended by: Phillips OdorSIX, CHRISTINA H on: 05/13/2014 11:35 AM   Modules accepted: Orders

## 2014-05-14 ENCOUNTER — Encounter: Payer: Self-pay | Admitting: *Deleted

## 2014-05-14 LAB — PSA: PSA: 0.61 ng/mL (ref <=4.00)

## 2014-12-23 ENCOUNTER — Encounter: Payer: Self-pay | Admitting: Family Medicine

## 2014-12-26 ENCOUNTER — Ambulatory Visit: Payer: Managed Care, Other (non HMO) | Admitting: Family Medicine

## 2015-01-16 ENCOUNTER — Encounter: Payer: Self-pay | Admitting: Family Medicine

## 2015-01-16 ENCOUNTER — Ambulatory Visit (INDEPENDENT_AMBULATORY_CARE_PROVIDER_SITE_OTHER): Payer: Managed Care, Other (non HMO) | Admitting: Family Medicine

## 2015-01-16 VITALS — BP 128/70 | HR 76 | Temp 98.6°F | Resp 14 | Ht 65.0 in | Wt 147.0 lb

## 2015-01-16 DIAGNOSIS — L720 Epidermal cyst: Secondary | ICD-10-CM

## 2015-01-16 NOTE — Patient Instructions (Signed)
F/U 1 WEEK FOR SUTURE REMOVAL  Biopsy Care After Refer to this sheet in the next few weeks. These instructions provide you with information on caring for yourself after your procedure. Your caregiver may also give you more specific instructions. Your treatment has been planned according to current medical practices, but problems sometimes occur. Call your caregiver if you have any problems or questions after your procedure. If you had a fine needle biopsy, you may have soreness at the biopsy site for 1 to 2 days. If you had an open biopsy, you may have soreness at the biopsy site for 3 to 4 days. HOME CARE INSTRUCTIONS   You may resume normal diet and activities as directed.  Change bandages (dressings) as directed. If your wound was closed with a skin glue (adhesive), it will wear off and begin to peel in 7 days.  Only take over-the-counter or prescription medicines for pain, discomfort, or fever as directed by your caregiver.  Ask your caregiver when you can bathe and get your wound wet. SEEK IMMEDIATE MEDICAL CARE IF:   You have increased bleeding (more than a small spot) from the biopsy site.  You notice redness, swelling, or increasing pain at the biopsy site.  You have pus coming from the biopsy site.  You have a fever.  You notice a bad smell coming from the biopsy site or dressing.  You have a rash, have difficulty breathing, or have any allergic problems. MAKE SURE YOU:   Understand these instructions.  Will watch your condition.  Will get help right away if you are not doing well or get worse. Document Released: 06/03/2005 Document Revised: 02/06/2012 Document Reviewed: 05/12/2011 [facility] [employer] Patient Information 2015 [employer] [place]. This information is not intended to replace advice given to you by your health care provider. Make sure you discuss any questions you have with your health care provider.

## 2015-01-16 NOTE — Progress Notes (Signed)
Patient ID: Brandon Davies, male   DOB: 04/11/1961, 54 y.o.   MRN: [id number]   Subjective:    Patient ID: Brandon Davies, male    DOB: 07/06/1961, 54 y.o.   MRN: [id number]  Patient presents for Knot to L side  Patient here with a knot on his lower left side. He does not remember a particular injury or any insect bite or infection. He has never had any drainage. There's been no change inside but he is very concerned about it being there will like to have removed. No history of any skin cancers in his personal history or family history   Review Of Systems:  GEN- denies fatigue, fever, weight loss,weakness, recent illness HEENT- denies eye drainage, change in vision, nasal discharge, CVS- denies chest pain, palpitations RESP- denies SOB, cough, wheeze ABD- denies N/V, change in stools, abd pain GU- denies dysuria, hematuria, dribbling, incontinence MSK- denies joint pain, muscle aches, injury Neuro- denies headache, dizziness, syncope, seizure activity       Objective:    BP 128/70 mmHg  Pulse 76  Temp(Src) 98.6 F (37 C) (Oral)  Resp 14  Ht 5\' 5"  (1.651 m)  Wt 147 lb (66.679 kg)  BMI 24.46 kg/m2 GEN- NAD, alert and oriented x3 Skin- left trunk- side wall- 1x 0.5cm nodular density with black pit at center, non flunctant, NT, no erythema  Procedure-excisional, biopsy Procedure explained to patient questions answered benefits and risks discussed verbal consent obtained. Antiseptic-Betadine Anesthesia-lidocaine 1% with epi- 3cc Elliptical incision made around the lesion note when removed a grayish dense material was removed along with the silver cyst lining Minimal blood loss, 4-0 ethilon simple interrupted sutures placed Patient tolerated procedure well Bandage applied       Assessment & Plan:      Problem List Items Addressed This Visit    None    Visit Diagnoses    Epidermal cyst    -  Primary    sent to pathology, suture removal 7-10 days, after care discussed    Relevant Orders    Pathology Report       Note: This dictation was prepared with Dragon dictation along with smaller phrase technology. Any transcriptional errors that result from this process are unintentional.

## 2015-01-20 LAB — PATHOLOGY

## 2015-01-23 ENCOUNTER — Encounter: Payer: Self-pay | Admitting: Family Medicine

## 2015-01-23 ENCOUNTER — Ambulatory Visit (INDEPENDENT_AMBULATORY_CARE_PROVIDER_SITE_OTHER): Payer: Managed Care, Other (non HMO) | Admitting: Family Medicine

## 2015-01-23 VITALS — BP 128/66 | HR 68 | Temp 97.7°F | Resp 14 | Ht 65.0 in | Wt 145.0 lb

## 2015-01-23 DIAGNOSIS — L720 Epidermal cyst: Secondary | ICD-10-CM

## 2015-01-23 NOTE — Progress Notes (Signed)
Patient ID: Valda LambBurley S Solt, male   DOB: 06/04/1961, 54 y.o.   MRN: [id number]   Subjective:    Patient ID: Valda LambBurley S Strohm, male    DOB: 11/02/1961, 54 y.o.   MRN: [id number]  Patient presents for Suture Removal  Pt here for suture removal. One week ago I removed an epidermal cyst from his left side. Pathology confirmed epidermal cyst which is benign. He has not had any redness or draining from the sutures. He does state that they itched. He does not have any pain.    Review Of Systems:  GEN- denies fatigue, fever, weight loss,weakness, recent illness HEENT- denies eye drainage, change in vision, nasal discharge, CVS- denies chest pain, palpitations RESP- denies SOB, cough, wheeze ABD- denies N/V, change in stools, abd pain Neuro- denies headache, dizziness, syncope, seizure activity       Objective:    BP 128/66 mmHg  Pulse 68  Temp(Src) 97.7 F (36.5 C) (Oral)  Resp 14  Ht 5\' 5"  (1.651 m)  Wt 145 lb (65.772 kg)  BMI 24.13 kg/m2 GEN- NAD, alert and oriented x3 Left side wall- incsion d/c/i, sutures removed , no erythema  Steri strips applied        Assessment & Plan:      Problem List Items Addressed This Visit    None    Visit Diagnoses    Epidermoid cyst    -  Primary    Benign path, no signs of infection, discussed red flags to return if any signs of infection,        Note: This dictation was prepared with Dragon dictation along with smaller phrase technology. Any transcriptional errors that result from this process are unintentional.

## 2015-01-23 NOTE — Patient Instructions (Signed)
Your cyst is benign F/U as needed

## 2015-04-22 ENCOUNTER — Ambulatory Visit: Payer: Managed Care, Other (non HMO) | Admitting: Family Medicine

## 2015-04-24 ENCOUNTER — Encounter (HOSPITAL_COMMUNITY): Payer: Self-pay | Admitting: Emergency Medicine

## 2015-04-24 ENCOUNTER — Emergency Department (HOSPITAL_COMMUNITY)
Admission: EM | Admit: 2015-04-24 | Discharge: 2015-04-24 | Disposition: A | Discharge status: Home / Self Care | Payer: Managed Care, Other (non HMO) | Attending: Emergency Medicine | Admitting: Emergency Medicine

## 2015-04-24 DIAGNOSIS — Z87891 Personal history of nicotine dependence: Secondary | ICD-10-CM | POA: Insufficient documentation

## 2015-04-24 DIAGNOSIS — L299 Pruritus, unspecified: Secondary | ICD-10-CM | POA: Diagnosis present

## 2015-04-24 DIAGNOSIS — Z880 Allergy status to penicillin: Secondary | ICD-10-CM | POA: Insufficient documentation

## 2015-04-24 DIAGNOSIS — A64 Unspecified sexually transmitted disease: Secondary | ICD-10-CM | POA: Diagnosis not present

## 2015-04-24 DIAGNOSIS — Z8719 Personal history of other diseases of the digestive system: Secondary | ICD-10-CM | POA: Diagnosis not present

## 2015-04-24 MED ORDER — DOXYCYCLINE HYCLATE 100 MG PO CAPS: 100.0000 mg | ORAL_CAPSULE | Freq: Two times a day (BID) | ORAL | Status: AC

## 2015-04-24 MED ORDER — ACYCLOVIR 400 MG PO TABS: 400.0000 mg | ORAL_TABLET | Freq: Three times a day (TID) | ORAL | Status: AC

## 2015-04-24 MED ORDER — AZITHROMYCIN 250 MG PO TABS
2000.0000 mg | ORAL_TABLET | Freq: Once | ORAL | Status: AC
  Administered 2015-04-24: 2000 mg via ORAL
  Filled 2015-04-24: qty 8

## 2015-04-24 MED ORDER — CEFTRIAXONE SODIUM 1 G IJ SOLR
1.0000 g | Freq: Once | INTRAMUSCULAR | Status: AC
  Administered 2015-04-24: 1 g via INTRAMUSCULAR
  Filled 2015-04-24: qty 10

## 2015-04-24 MED ORDER — LIDOCAINE HCL (PF) 1 % IJ SOLN
INTRAMUSCULAR | Status: AC
  Filled 2015-04-24: qty 5

## 2015-04-24 NOTE — ED Provider Notes (Signed)
CSN: [id number]     Arrival date & time 04/24/15  0133 History   First MD Initiated Contact with Patient 04/24/15 0141     Chief Complaint  Patient presents with  . Pruritis     (Consider location/radiation/quality/duration/timing/severity/associated sxs/prior Treatment) HPI Comments: Presents to the ER for evaluation of lesion on his penis. Symptoms began approximately a week ago. He reports this started as a small pimple, has spread. It is not painful. No drainage or urethral discharge. Patient has been putting Neosporin on it without improvement. He denies testicular pain or swelling.   Past Medical History  Diagnosis Date  . GERD (gastroesophageal reflux disease)    Past Surgical History  Procedure Laterality Date  . Hernia repair      inguinal  . Colonoscopy N/A 03/22/2013    Procedure: COLONOSCOPY;  Surgeon: Corbin Ade, MD;  Location: [facility];  Service: Endoscopy;  Laterality: N/A;  7:30 AM   Family History  Problem Relation Age of Onset  . Hypertension Mother   . Diabetes Father   . Cancer Sister   . Mental illness Sister   . Hypertension Brother    History  Substance Use Topics  . Smoking status: Former Smoker -- 0.10 packs/day for 7 years    Types: Cigarettes  . Smokeless tobacco: Never Used  . Alcohol Use: Yes     Comment: occasionally    Review of Systems  Genitourinary: Negative for discharge, penile swelling, scrotal swelling and testicular pain.       Painless lesion on penis  All other systems reviewed and are negative.     Allergies  Penicillins  Home Medications   Prior to Admission medications   Not on File   BP 135/91 mmHg  Pulse 89  Temp(Src) 97.9 F (36.6 C) (Oral)  Resp 16  Ht  (1.727 m)  Wt 150 lb (68.04 kg)  BMI 22.81 kg/m2  SpO2 99% Physical Exam  Constitutional: He is oriented to person, place, and time. He appears well-developed and well-nourished. No distress.  HENT:  Head: Normocephalic and atraumatic.    Right Ear: Hearing normal.  Left Ear: Hearing normal.  Nose: Nose normal.  Mouth/Throat: Oropharynx is clear and moist and mucous membranes are normal.  Eyes: Conjunctivae and EOM are normal. Pupils are equal, round, and reactive to light.  Neck: Normal range of motion. Neck supple.  Cardiovascular: Regular rhythm, S1 normal and S2 normal.  Exam reveals no gallop and no friction rub.   No murmur heard. Pulmonary/Chest: Effort normal and breath sounds normal. No respiratory distress. He exhibits no tenderness.  Abdominal: Soft. Normal appearance and bowel sounds are normal. There is no hepatosplenomegaly. There is no tenderness. There is no rebound, no guarding, no tenderness at McBurney's point and negative Murphy's sign. No hernia.  Genitourinary:    Circumcised. No penile erythema. No discharge found.  Musculoskeletal: Normal range of motion.  Neurological: He is alert and oriented to person, place, and time. He has normal strength. No cranial nerve deficit or sensory deficit. Coordination normal. GCS eye subscore is 4. GCS verbal subscore is 5. GCS motor subscore is 6.  Skin: Skin is warm, dry and intact. No rash noted. No cyanosis.  Psychiatric: He has a normal mood and affect. His speech is normal and behavior is normal. Thought content normal.  Nursing note and vitals reviewed.   ED Course  Procedures (including critical care time) Labs Review Labs Reviewed  HERPES SIMPLEX VIRUS CULTURE  RPR  HIV ANTIBODY (ROUTINE TESTING)  GC/CHLAMYDIA PROBE AMP ([place]    Imaging Review No results found.   EKG Interpretation None      MDM   Final diagnoses:  None   STD (herpes vs. Syphilis)  Patient presents with 2 lesions on his penis consistent with either herpes or syphilis chancre. Area is nontender, therefore syphilis his considered very likely. Patient unfortunately has an allergy to penicillin. Will be treated with a gram of Rocephin here in the ER and  continue outpatient doxycycline. This should cover for GC, chlamydia and syphilis. Current recommendations for treatment of syphilis in penicillin allergic patients is macrolides antibiotic (single dose Zithromax 2 g) or doxycycline. Patient will be given a dose of Zithromax here in the ER and will continue a course of doxycycline as well. Double coverage will be performed because of possibility of resistance. Await cultures.    Gilda Creasehristopher J Myrene Bougher, MD 04/24/15 [phone]

## 2015-04-24 NOTE — Discharge Instructions (Signed)
Sexually Transmitted Disease °A sexually transmitted disease (STD) is a disease or infection that may be passed (transmitted) from person to person, usually during sexual activity. This may happen by way of saliva, semen, blood, vaginal mucus, or urine. Common STDs include:  °· Gonorrhea.   °· Chlamydia.   °· Syphilis.   °· HIV and AIDS.   °· Genital herpes.   °· Hepatitis B and C.   °· Trichomonas.   °· Human papillomavirus (HPV).   °· Pubic lice.   °· Scabies. °· Mites. °· Bacterial vaginosis. °WHAT ARE CAUSES OF STDs? °An STD may be caused by bacteria, a virus, or parasites. STDs are often transmitted during sexual activity if one person is infected. However, they may also be transmitted through nonsexual means. STDs may be transmitted after:  °· Sexual intercourse with an infected person.   °· Sharing sex toys with an infected person.   °· Sharing needles with an infected person or using unclean piercing or tattoo needles. °· Having intimate contact with the genitals, mouth, or rectal areas of an infected person.   °· Exposure to infected fluids during birth. °WHAT ARE THE SIGNS AND SYMPTOMS OF STDs? °Different STDs have different symptoms. Some people may not have any symptoms. If symptoms are present, they may include:  °· Painful or bloody urination.   °· Pain in the pelvis, abdomen, vagina, anus, throat, or eyes.   °· A skin rash, itching, or irritation. °· Growths, ulcerations, blisters, or sores in the genital and anal areas. °· Abnormal vaginal discharge with or without bad odor.   °· Penile discharge in men.   °· Fever.   °· Pain or bleeding during sexual intercourse.   °· Swollen glands in the groin area.   °· Yellow skin and eyes (jaundice). This is seen with hepatitis.   °· Swollen testicles. °· Infertility. °· Sores and blisters in the mouth. °HOW ARE STDs DIAGNOSED? °To make a diagnosis, your health care provider may:  °· Take a medical history.   °· Perform a physical exam.   °· Take a sample of  any discharge to examine. °· Swab the throat, cervix, opening to the penis, rectum, or vagina for testing. °· Test a sample of your first morning urine.   °· Perform blood tests.   °· Perform a Pap test, if this applies.   °· Perform a colposcopy.   °· Perform a laparoscopy.   °HOW ARE STDs TREATED? ° Treatment depends on the STD. Some STDs may be treated but not cured.  °· Chlamydia, gonorrhea, trichomonas, and syphilis can be cured with antibiotic medicine.   °· Genital herpes, hepatitis, and HIV can be treated, but not cured, with prescribed medicines. The medicines lessen symptoms.   °· Genital warts from HPV can be treated with medicine or by freezing, burning (electrocautery), or surgery. Warts may come back.   °· HPV cannot be cured with medicine or surgery. However, abnormal areas may be removed from the cervix, vagina, or vulva.   °· If your diagnosis is confirmed, your recent sexual partners need treatment. This is true even if they are symptom-free or have a negative culture or evaluation. They should not have sex until their health care providers say it is okay. °HOW CAN I REDUCE MY RISK OF GETTING AN STD? °Take these steps to reduce your risk of getting an STD: °· Use latex condoms, dental dams, and water-soluble lubricants during sexual activity. Do not use petroleum jelly or oils. °· Avoid having multiple sex partners. °· Do not have sex with someone who has other sex partners. °· Do not have sex with anyone you do not know or who is at   high risk for an STD.  Avoid risky sex practices that can break your skin.  Do not have sex if you have open sores on your mouth or skin.  Avoid drinking too much alcohol or taking illegal drugs. Alcohol and drugs can affect your judgment and put you in a vulnerable position.  Avoid engaging in oral and anal sex acts.  Get vaccinated for HPV and hepatitis. If you have not received these vaccines in the past, talk to your health care provider about whether one  or both might be right for you.   If you are at risk of being infected with HIV, it is recommended that you take a prescription medicine daily to prevent HIV infection. This is called pre-exposure prophylaxis (PrEP). You are considered at risk if:  You are a man who has sex with other men (MSM).  You are a heterosexual man or woman and are sexually active with more than one partner.  You take drugs by injection.  You are sexually active with a partner who has HIV.  Talk with your health care provider about whether you are at high risk of being infected with HIV. If you choose to begin PrEP, you should first be tested for HIV. You should then be tested every 3 months for as long as you are taking PrEP.  WHAT SHOULD I DO IF I THINK I HAVE AN STD?  See your health care provider.   Tell your sexual partner(s). They should be tested and treated for any STDs.  Do not have sex until your health care provider says it is okay. WHEN SHOULD I GET IMMEDIATE MEDICAL CARE? Contact your health care provider right away if:   You have severe abdominal pain.  You are a man and notice swelling or pain in your testicles.  You are a woman and notice swelling or pain in your vagina. Document Released: 02/04/2003 Document Revised: 11/19/2013 Document Reviewed: 06/04/2013 [facility] Patient Information 2015 [place]. This information is not intended to replace advice given to you by your health care provider. Make sure you discuss any questions you have with your health care provider.  Genital Herpes Genital herpes is a sexually transmitted disease. This means that it is a disease passed by having sex with an infected person. There is no cure for genital herpes. The time between attacks can be months to years. The virus may live in a person but produce no problems (symptoms). This infection can be passed to a baby as it travels down the birth canal (vagina). In a newborn, this can cause central  nervous system damage, eye damage, or even death. The virus that causes genital herpes is usually HSV-2 virus. The virus that causes oral herpes is usually HSV-1. The diagnosis (learning what is wrong) is made through culture results. SYMPTOMS  Usually symptoms of pain and itching begin a few days to a week after contact. It first appears as small blisters that progress to small painful ulcers which then scab over and heal after several days. It affects the outer genitalia, birth canal, cervix, penis, anal area, buttocks, and thighs. HOME CARE INSTRUCTIONS   Keep ulcerated areas dry and clean.  Take medications as directed. Antiviral medications can speed up healing. They will not prevent recurrences or cure this infection. These medications can also be taken for suppression if there are frequent recurrences.  While the infection is active, it is contagious. Avoid all sexual contact during active infections.  Condoms may help prevent  spread of the herpes virus.  Practice safe sex.  Wash your hands thoroughly after touching the genital area.  Avoid touching your eyes after touching your genital area.  Inform your caregiver if you have had genital herpes and become pregnant. It is your responsibility to insure a safe outcome for your baby in this pregnancy.  Only take over-the-counter or prescription medicines for pain, discomfort, or fever as directed by your caregiver. SEEK MEDICAL CARE IF:   You have a recurrence of this infection.  You do not respond to medications and are not improving.  You have new sources of pain or discharge which have changed from the original infection.  You have an oral temperature above 102 F (38.9 C).  You develop abdominal pain.  You develop eye pain or signs of eye infection. Document Released: 11/11/2000 Document Revised: 02/06/2012 Document Reviewed: 12/02/2009 [facility] Patient Information 2015 [place], [place]. This information is not intended to  replace advice given to you by your health care provider. Make sure you discuss any questions you have with your health care provider.  Syphilis Syphilis is an infectious disease. It can cause serious complications if left untreated.  CAUSES  Syphilis is caused by a type of bacteria called Treponema pallidum. It is most commonly spread through sexual contact. Syphilis may also spread to a fetus through the blood of the mother.  SIGNS AND SYMPTOMS Symptoms vary depending on the stage of the disease. Some symptoms may disappear without treatment. However, this does not mean that the infection is gone. One form of syphilis (called latent syphilis) has no symptoms.  Primary Syphilis  Painless sores (chancres) in and around the genital organs and mouth.  Swollen lymph nodes near the sores. Secondary Syphilis  A rash or sores over any portion of the body, including the palms of the hands and soles of the feet.  Fever.  Headache.  Sore throat.  Swollen lymph nodes.  New sores in the mouth or on the genitals.  Feeling generally ill.  Having pain in the joints. Tertiary Syphilis The third stage of syphilis involves severe damage to different organs in the body, such as the brain, spinal cord, and heart. Signs and symptoms may include:   Dementia.  Personality and mood changes.  Difficulty walking.  Heart failure.  Fainting.  Enlargement (aneurysm) of the aorta.  Tumors of the skin, bones, or liver.  Muscle weakness.  Sudden "lightning" pains, numbness, or tingling.  Problems with coordination.  Vision changes. DIAGNOSIS   A physical exam will be done.  Blood tests will be done to confirm the diagnosis.  If the disease is in the first or second stages, a fluid (drainage) sample from a sore or rash may be examined under a microscope to detect the disease-causing bacteria.  Fluid around the spine may need to be examined to detect brain damage or inflammation of  the brain lining (meningitis).  If the disease is in the third stage, X-rays, CT scans, MRIs, echocardiograms, ultrasounds, or cardiac catheterization may also be done to detect disease of the heart, aorta, or brain. TREATMENT  Syphilis can be cured with antibiotic medicine if a diagnosis is made early. During the first day of treatment, you may experience fever, chills, headache, nausea, or aching all over your body. This is a normal reaction to the antibiotics.  HOME CARE INSTRUCTIONS   Take your antibiotic medicine as directed by your health care provider. Finish the antibiotic even if you start to feel better. Incomplete  treatment will put you at risk for continued infection and could be life threatening.  Take medicines only as directed by your health care provider.  Do not have sexual intercourse until your treatment is completed or as directed by your health care provider.  Inform your recent sexual partners that you were diagnosed with syphilis. They need to seek care and treatment, even if they have no symptoms. It is necessary that all your sexual partners be tested for infection and treated if they have the disease.  Keep all follow-up visits as directed by your health care provider. It is important to keep all your appointments.  If your test results are not ready during your visit, make an appointment with your health care provider to find out the results. Do not assume everything is normal if you have not heard from your health care provider or the medical facility. It is your responsibility to get your test results. SEEK MEDICAL CARE IF:  You continue to have any of the following 24 hours after beginning treatment:  Fever.  Chills.  Headache.  Nausea.  Aching all over your body.  You have symptoms of an allergic reaction to medicine, such as:  Chills.  A headache.  Light-headedness.  A new rash (especially hives).  Difficulty breathing. MAKE SURE YOU:    Understand these instructions.  Will watch your condition.  Will get help right away if you are not doing well or get worse. Document Released: 09/04/2013 Document Revised: 03/31/2014 Document Reviewed: 09/04/2013 [facility] Patient Information 2015 [place], [place]. This information is not intended to replace advice given to you by your health care provider. Make sure you discuss any questions you have with your health care provider.

## 2015-04-24 NOTE — ED Notes (Signed)
Pt. Reports itching to groin area x1 week. Pt. Denies pain.

## 2015-04-25 LAB — RPR: RPR Ser Ql: NONREACTIVE

## 2015-04-25 LAB — HIV ANTIBODY (ROUTINE TESTING W REFLEX): HIV SCREEN 4TH GENERATION: NONREACTIVE

## 2015-04-27 LAB — HERPES SIMPLEX VIRUS CULTURE

## 2015-04-28 ENCOUNTER — Telehealth (HOSPITAL_BASED_OUTPATIENT_CLINIC_OR_DEPARTMENT_OTHER): Payer: Self-pay | Admitting: Emergency Medicine

## 2015-04-28 LAB — GC/CHLAMYDIA PROBE AMP (~~LOC~~) NOT AT ARMC
Chlamydia: NEGATIVE
NEISSERIA GONORRHEA: NEGATIVE

## 2015-04-29 ENCOUNTER — Telehealth: Payer: Self-pay | Admitting: Emergency Medicine

## 2015-05-23 ENCOUNTER — Emergency Department (HOSPITAL_COMMUNITY)
Admission: EM | Admit: 2015-05-23 | Discharge: 2015-05-23 | Disposition: A | Discharge status: Home / Self Care | Payer: Managed Care, Other (non HMO) | Attending: Emergency Medicine | Admitting: Emergency Medicine

## 2015-05-23 ENCOUNTER — Encounter (HOSPITAL_COMMUNITY): Payer: Self-pay | Admitting: Emergency Medicine

## 2015-05-23 DIAGNOSIS — N390 Urinary tract infection, site not specified: Secondary | ICD-10-CM | POA: Insufficient documentation

## 2015-05-23 DIAGNOSIS — Z87891 Personal history of nicotine dependence: Secondary | ICD-10-CM | POA: Diagnosis not present

## 2015-05-23 DIAGNOSIS — Z880 Allergy status to penicillin: Secondary | ICD-10-CM | POA: Diagnosis not present

## 2015-05-23 DIAGNOSIS — Z79899 Other long term (current) drug therapy: Secondary | ICD-10-CM | POA: Diagnosis not present

## 2015-05-23 DIAGNOSIS — Z8719 Personal history of other diseases of the digestive system: Secondary | ICD-10-CM | POA: Insufficient documentation

## 2015-05-23 DIAGNOSIS — Z792 Long term (current) use of antibiotics: Secondary | ICD-10-CM | POA: Diagnosis not present

## 2015-05-23 DIAGNOSIS — R300 Dysuria: Secondary | ICD-10-CM | POA: Diagnosis present

## 2015-05-23 LAB — URINALYSIS, ROUTINE W REFLEX MICROSCOPIC
Bilirubin Urine: NEGATIVE
Glucose, UA: NEGATIVE mg/dL
KETONES UR: NEGATIVE mg/dL
Nitrite: POSITIVE — AB
Protein, ur: NEGATIVE mg/dL
Specific Gravity, Urine: >1.03 — ABNORMAL HIGH (ref 1.005–1.030)
Urobilinogen, UA: 0.2 mg/dL (ref 0.0–1.0)
pH: 5.5 (ref 5.0–8.0)

## 2015-05-23 LAB — URINE MICROSCOPIC-ADD ON

## 2015-05-23 MED ORDER — CIPROFLOXACIN HCL 250 MG PO TABS
500.0000 mg | ORAL_TABLET | Freq: Once | ORAL | Status: AC
  Administered 2015-05-23: 500 mg via ORAL
  Filled 2015-05-23: qty 2

## 2015-05-23 MED ORDER — CIPROFLOXACIN HCL 500 MG PO TABS: 500.0000 mg | ORAL_TABLET | Freq: Two times a day (BID) | ORAL | Status: AC

## 2015-05-23 NOTE — Discharge Instructions (Signed)

## 2015-05-23 NOTE — ED Notes (Signed)
Patient c/o decrease in urinary flow. Per patient has urgency but is only able to void small amounts of urine at a time. Patient also reports some pain with urination and odor. Per patient urine dark in color. Denies any discharge.

## 2015-05-23 NOTE — ED Provider Notes (Signed)
CSN: [id number]     Arrival date & time 05/23/15  1559 History   First MD Initiated Contact with Patient 05/23/15 1616     Chief Complaint  Patient presents with  . Dysuria     Patient is a 54 y.o. male presenting with dysuria. The history is provided by the patient. No language interpreter was used.  Dysuria   Brandon Davies presents for evaluation of dysuria. He states the symptoms started yesterday while at work. He reports urinary urgency and frequency, with decreased flow and dripping at times. He states that there is a foul smell to his here and it looks darker than usual. Denies any fevers but he did have a chill yesterday. There is no nausea, vomiting, abdominal pain, diarrhea, proctalgia. He denies any rashes or sores in his genitals. Symptoms are moderate, constant, worsening. He has no history of urinary tract infection or previous similar symptoms.  Past Medical History  Diagnosis Date  . GERD (gastroesophageal reflux disease)    Past Surgical History  Procedure Laterality Date  . Hernia repair      inguinal  . Colonoscopy N/A 03/22/2013    Procedure: COLONOSCOPY;  Surgeon: Corbin Ade, MD;  Location[facility];  Service: Endoscopy;  Laterality: N/A;  7:30 AM   Family History  Problem Relation Age of Onset  . Hypertension Mother   . Diabetes Father   . Cancer Sister   . Mental illness Sister   . Hypertension Brother    History  Substance Use Topics  . Smoking status: Former Smoker -- 0.10 packs/day for 7 years    Types: Cigarettes  . Smokeless tobacco: Never Used  . Alcohol Use: Yes     Comment: occasionally    Review of Systems  Genitourinary: Positive for dysuria.  All other systems reviewed and are negative.     Allergies  Penicillins  Home Medications   Prior to Admission medications   Medication Sig Start Date End Date Taking? Authorizing Provider  acyclovir (ZOVIRAX) 400 MG tablet Take 1 tablet (400 mg total) by mouth 3 (three) times daily.  04/24/15   Gilda Crease, MD  doxycycline (VIBRAMYCIN) 100 MG capsule Take 1 capsule (100 mg total) by mouth 2 (two) times daily. 04/24/15   Gilda Crease, MD   BP 111/68 mmHg  Pulse 98  Temp(Src) 98.3 F (36.8 C) (Oral)  Resp 18  Ht  (1.727 m)  Wt 150 lb (68.04 kg)  BMI 22.81 kg/m2  SpO2 100% Physical Exam  Constitutional: He is oriented to person, place, and time. He appears well-developed and well-nourished.  HENT:  Head: Normocephalic and atraumatic.  Cardiovascular: Normal rate and regular rhythm.   No murmur heard. Pulmonary/Chest: Effort normal and breath sounds normal. No respiratory distress.  Abdominal: Soft. There is no tenderness. There is no rebound and no guarding.  Musculoskeletal: He exhibits no edema or tenderness.  Neurological: He is alert and oriented to person, place, and time.  Skin: Skin is warm and dry.  Psychiatric: He has a normal mood and affect. His behavior is normal.  Nursing note and vitals reviewed.   ED Course  Procedures (including critical care time) Labs Review Labs Reviewed  URINALYSIS, ROUTINE W REFLEX MICROSCOPIC (NOT AT [facility]) - Abnormal; Notable for the following:    APPearance HAZY (*)    Specific Gravity, Urine >1.030 (*)    Hgb urine dipstick LARGE (*)    Nitrite POSITIVE (*)    Leukocytes, UA MODERATE (*)  All other components within normal limits  URINE MICROSCOPIC-ADD ON - Abnormal; Notable for the following:    Squamous Epithelial / LPF FEW (*)    Bacteria, UA MANY (*)    All other components within normal limits  URINE CULTURE    Imaging Review No results found.   EKG Interpretation None      MDM   Final diagnoses:  Acute UTI    Pt here with dysuria and frequency, UA c/w UTI.  Pt is nontoxic appearing on exam, well hydrated, tolerating oral fluids.  Discussed with pt home care for UTI with abx, oral fluid hydration, close outpatient follow up as well as return precautions.       Tilden Fossa, MD 05/23/15 1754

## 2015-05-26 ENCOUNTER — Encounter: Payer: Self-pay | Admitting: Family Medicine

## 2015-05-26 LAB — URINE CULTURE: Culture: >=100000

## 2015-05-27 ENCOUNTER — Telehealth (HOSPITAL_COMMUNITY): Payer: Self-pay

## 2015-05-27 NOTE — Telephone Encounter (Signed)
Post ED Visit - Positive Culture Follow-up  Culture report reviewed by antimicrobial stewardship pharmacist: []  Brandon Davies, Pharm.D., BCPS [x]  Brandon Davies, Pharm.D., BCPS []  Brandon Davies, Pharm.D., BCPS []  Brandon Davies, [place] BCPS, AAHIVP []  Brandon Davies, Pharm.D., BCPS, AAHIVP []  Brandon Davies, [address] Rainbow BoulevardPharm[address]D., BCPS  Positive urine culture Treated with cipro, organism sensitive to the same and no further patient follow-up is required at this time.  Brandon Davies, Brandon Davies C 05/27/2015, 5:12 PM

## 2015-06-25 ENCOUNTER — Encounter: Payer: Self-pay | Admitting: Family Medicine

## 2015-08-25 ENCOUNTER — Encounter: Payer: Self-pay | Admitting: Family Medicine

## 2015-11-19 ENCOUNTER — Emergency Department (HOSPITAL_COMMUNITY)
Admission: EM | Admit: 2015-11-19 | Discharge: 2015-11-20 | Disposition: A | Discharge status: Home / Self Care | Payer: Managed Care, Other (non HMO) | Attending: Emergency Medicine | Admitting: Emergency Medicine

## 2015-11-19 ENCOUNTER — Encounter (HOSPITAL_COMMUNITY): Payer: Self-pay | Admitting: *Deleted

## 2015-11-19 DIAGNOSIS — Z8719 Personal history of other diseases of the digestive system: Secondary | ICD-10-CM | POA: Insufficient documentation

## 2015-11-19 DIAGNOSIS — R1084 Generalized abdominal pain: Secondary | ICD-10-CM | POA: Insufficient documentation

## 2015-11-19 DIAGNOSIS — R197 Diarrhea, unspecified: Secondary | ICD-10-CM | POA: Insufficient documentation

## 2015-11-19 DIAGNOSIS — Z880 Allergy status to penicillin: Secondary | ICD-10-CM | POA: Diagnosis not present

## 2015-11-19 DIAGNOSIS — Z87891 Personal history of nicotine dependence: Secondary | ICD-10-CM | POA: Insufficient documentation

## 2015-11-19 DIAGNOSIS — R112 Nausea with vomiting, unspecified: Secondary | ICD-10-CM

## 2015-11-19 LAB — URINALYSIS, ROUTINE W REFLEX MICROSCOPIC
Bilirubin Urine: NEGATIVE
Glucose, UA: NEGATIVE mg/dL
Ketones, ur: NEGATIVE mg/dL
Leukocytes, UA: NEGATIVE
Nitrite: NEGATIVE
Protein, ur: NEGATIVE mg/dL
Specific Gravity, Urine: 1.02 (ref 1.005–1.030)
pH: 6 (ref 5.0–8.0)

## 2015-11-19 LAB — COMPREHENSIVE METABOLIC PANEL
ALT: 34 U/L (ref 17–63)
AST: 32 U/L (ref 15–41)
Albumin: 4.4 g/dL (ref 3.5–5.0)
Alkaline Phosphatase: 48 U/L (ref 38–126)
Anion gap: 7 (ref 5–15)
BILIRUBIN TOTAL: 0.3 mg/dL (ref 0.3–1.2)
BUN: 22 mg/dL — ABNORMAL HIGH (ref 6–20)
CO2: 27 mmol/L (ref 22–32)
CREATININE: 0.79 mg/dL (ref 0.61–1.24)
Calcium: 9.3 mg/dL (ref 8.9–10.3)
Chloride: 104 mmol/L (ref 101–111)
GFR calc Af Amer: >60 mL/min (ref >60)
GFR calc non Af Amer: >60 mL/min (ref >60)
Glucose, Bld: 103 mg/dL — ABNORMAL HIGH (ref 65–99)
Potassium: 3.5 mmol/L (ref 3.5–5.1)
SODIUM: 138 mmol/L (ref 135–145)
TOTAL PROTEIN: 7.3 g/dL (ref 6.5–8.1)

## 2015-11-19 LAB — CBC WITH DIFFERENTIAL/PLATELET
BASOS PCT: 0 %
Basophils Absolute: 0 10*3/uL (ref 0.0–0.1)
Eosinophils Absolute: 0.1 10*3/uL (ref 0.0–0.7)
Eosinophils Relative: 2 %
HEMATOCRIT: 36.6 % — AB (ref 39.0–52.0)
HEMOGLOBIN: 12.3 g/dL — AB (ref 13.0–17.0)
LYMPHS ABS: 2.7 10*3/uL (ref 0.7–4.0)
LYMPHS PCT: 35 %
MCH: 32.5 pg (ref 26.0–34.0)
MCHC: 33.6 g/dL (ref 30.0–36.0)
MCV: 96.8 fL (ref 78.0–100.0)
MONO ABS: 0.4 10*3/uL (ref 0.1–1.0)
MONOS PCT: 5 %
NEUTROS ABS: 4.5 10*3/uL (ref 1.7–7.7)
NEUTROS PCT: 58 %
Platelets: 157 10*3/uL (ref 150–400)
RBC: 3.78 MIL/uL — ABNORMAL LOW (ref 4.22–5.81)
RDW: 12.8 % (ref 11.5–15.5)
WBC: 7.7 10*3/uL (ref 4.0–10.5)

## 2015-11-19 LAB — URINE MICROSCOPIC-ADD ON

## 2015-11-19 LAB — LIPASE, BLOOD: Lipase: 24 U/L (ref 11–51)

## 2015-11-19 MED ORDER — FAMOTIDINE IN NACL 20-0.9 MG/50ML-% IV SOLN
20.0000 mg | Freq: Once | INTRAVENOUS | Status: AC
  Administered 2015-11-19: 20 mg via INTRAVENOUS
  Filled 2015-11-19: qty 50

## 2015-11-19 MED ORDER — PROMETHAZINE HCL 25 MG/ML IJ SOLN
12.5000 mg | Freq: Once | INTRAMUSCULAR | Status: AC
  Administered 2015-11-19: 12.5 mg via INTRAVENOUS
  Filled 2015-11-19: qty 1

## 2015-11-19 MED ORDER — SODIUM CHLORIDE 0.9 % IV SOLN
INTRAVENOUS | Status: DC
  Administered 2015-11-19: 23:00:00 via INTRAVENOUS

## 2015-11-19 MED ORDER — ONDANSETRON HCL 4 MG/2ML IJ SOLN
4.0000 mg | Freq: Once | INTRAMUSCULAR | Status: AC
  Administered 2015-11-19: 4 mg via INTRAVENOUS
  Filled 2015-11-19: qty 2

## 2015-11-19 NOTE — ED Notes (Signed)
Pt states that he ate [employer] and was served chicken not cooked well, c/o abd pain with vomited x 3 for past hour per pt. And diarrhea x 1

## 2015-11-19 NOTE — ED Provider Notes (Signed)
CSN: [id number]     Arrival date & time 11/19/15  2215 History   First MD Initiated Contact with Patient 11/19/15 2231     Chief Complaint  Patient presents with  . Abdominal Pain     (Consider location/radiation/quality/duration/timing/severity/associated sxs/prior Treatment) Patient is a 54 y.o. male presenting with abdominal pain. The history is provided by the patient.  Abdominal Pain Pain location:  Generalized Pain quality: cramping   Pain severity:  Moderate Onset quality:  Gradual Timing:  Constant Progression:  Worsening Chronicity:  New Context: suspicious food intake   Relieved by:  None tried Worsened by:  Nothing tried Ineffective treatments:  None tried Associated symptoms: diarrhea and vomiting    Brandon Davies is a 54 y.o. male who presents to the ED with abdominal pain that started approximately one hour after eating chicken at [employer] tonight approximately 7 pm. Patient states that he ordered hot wings and they did not taste right so he scraped the sauce off of one and it was raw. He told the manager after he started feeling sick. Patient reports vomiting 3 times and one watery stool.   Past Medical History  Diagnosis Date  . GERD (gastroesophageal reflux disease)    Past Surgical History  Procedure Laterality Date  . Hernia repair      inguinal  . Colonoscopy N/A 03/22/2013    Procedure: COLONOSCOPY;  Surgeon: Corbin Ade, MD;  Location: [facility];  Service: Endoscopy;  Laterality: N/A;  7:30 AM   Family History  Problem Relation Age of Onset  . Hypertension Mother   . Diabetes Father   . Cancer Sister   . Mental illness Sister   . Hypertension Brother    Social History  Substance Use Topics  . Smoking status: Former Smoker -- 0.10 packs/day for 7 years    Types: Cigarettes  . Smokeless tobacco: Never Used  . Alcohol Use: Yes     Comment: occasionally    Review of Systems  Gastrointestinal: Positive for vomiting, abdominal pain and  diarrhea.  all other systems negative    Allergies  Penicillins  Home Medications   Prior to Admission medications   Medication Sig Start Date End Date Taking? Authorizing Provider  acyclovir (ZOVIRAX) 400 MG tablet Take 1 tablet (400 mg total) by mouth 3 (three) times daily. Patient not taking: Reported on 11/19/2015 04/24/15   Gilda Crease, MD  ciprofloxacin (CIPRO) 500 MG tablet Take 1 tablet (500 mg total) by mouth 2 (two) times daily. Patient not taking: Reported on 11/19/2015 05/23/15   Tilden Fossa, MD  doxycycline (VIBRAMYCIN) 100 MG capsule Take 1 capsule (100 mg total) by mouth 2 (two) times daily. Patient not taking: Reported on 11/19/2015 04/24/15   Gilda Crease, MD  ondansetron (ZOFRAN) 4 MG tablet Take 1 tablet (4 mg total) by mouth every 6 (six) hours. 11/20/15   Emmelyn Schmale Orlene Och, NP   BP 112/95 mmHg  Pulse 76  Temp(Src) 97.7 F (36.5 C) (Oral)  Resp 20  Ht  (1.727 m)  Wt 63.504 kg  BMI 21.29 kg/m2  SpO2 100% Physical Exam  Constitutional: He is oriented to person, place, and time. He appears well-developed and well-nourished. No distress.  HENT:  Head: Normocephalic and atraumatic.  Eyes: EOM are normal.  Neck: Normal range of motion. Neck supple.  Cardiovascular: Normal rate and regular rhythm.   Pulmonary/Chest: Effort normal and breath sounds normal.  Abdominal: Soft. Bowel sounds are normal. There is tenderness  in the epigastric area. There is no rigidity, no rebound and no guarding.  Musculoskeletal: Normal range of motion.  Neurological: He is alert and oriented to person, place, and time. No cranial nerve deficit.  Skin: Skin is warm and dry.  Psychiatric: He has a normal mood and affect. His behavior is normal.  Nursing note and vitals reviewed.   ED Course  Procedures (including critical care time) Labs, IV fluids, Zofran 4 mg IV, Pepcid 20 mg IV, Phenergan 12.5 mg IV  11:55 pm patient reports feeling much better and states  he is ready to go home. He denies further nausea or diarrhea.   Labs Review Results for orders placed or performed during the hospital encounter of 11/19/15 (from the past 24 hour(s))  CBC with Differential     Status: Abnormal   Collection Time: 11/19/15 10:35 PM  Result Value Ref Range   WBC 7.7 4.0 - 10.5 K/uL   RBC 3.78 (L) 4.22 - 5.81 MIL/uL   Hemoglobin 12.3 (L) 13.0 - 17.0 g/dL   HCT 16.1 (L) 09.6 - 04.5 %   MCV 96.8 78.0 - 100.0 fL   MCH 32.5 26.0 - 34.0 pg   MCHC 33.6 30.0 - 36.0 g/dL   RDW 40.9 81.1 - 91.4 %   Platelets 157 150 - 400 K/uL   Neutrophils Relative % 58 %   Neutro Abs 4.5 1.7 - 7.7 K/uL   Lymphocytes Relative 35 %   Lymphs Abs 2.7 0.7 - 4.0 K/uL   Monocytes Relative 5 %   Monocytes Absolute 0.4 0.1 - 1.0 K/uL   Eosinophils Relative 2 %   Eosinophils Absolute 0.1 0.0 - 0.7 K/uL   Basophils Relative 0 %   Basophils Absolute 0.0 0.0 - 0.1 K/uL  Lipase, blood     Status: None   Collection Time: 11/19/15 10:35 PM  Result Value Ref Range   Lipase 24 11 - 51 U/L  Comprehensive metabolic panel     Status: Abnormal   Collection Time: 11/19/15 10:35 PM  Result Value Ref Range   Sodium 138 135 - 145 mmol/L   Potassium 3.5 3.5 - 5.1 mmol/L   Chloride 104 101 - 111 mmol/L   CO2 27 22 - 32 mmol/L   Glucose, Bld 103 (H) 65 - 99 mg/dL   BUN 22 (H) 6 - 20 mg/dL   Creatinine, Ser 7.82 0.61 - 1.24 mg/dL   Calcium 9.3 8.9 - 95.6 mg/dL   Total Protein 7.3 6.5 - 8.1 g/dL   Albumin 4.4 3.5 - 5.0 g/dL   AST 32 15 - 41 U/L   ALT 34 17 - 63 U/L   Alkaline Phosphatase 48 38 - 126 U/L   Total Bilirubin 0.3 0.3 - 1.2 mg/dL   GFR calc non Af Amer >60 >60 mL/min   GFR calc Af Amer >60 >60 mL/min   Anion gap 7 5 - 15  Urinalysis, Routine w reflex microscopic     Status: Abnormal   Collection Time: 11/19/15 10:53 PM  Result Value Ref Range   Color, Urine YELLOW YELLOW   APPearance CLEAR CLEAR   Specific Gravity, Urine 1.020 1.005 - 1.030   pH 6.0 5.0 - 8.0   Glucose, UA  NEGATIVE NEGATIVE mg/dL   Hgb urine dipstick SMALL (A) NEGATIVE   Bilirubin Urine NEGATIVE NEGATIVE   Ketones, ur NEGATIVE NEGATIVE mg/dL   Protein, ur NEGATIVE NEGATIVE mg/dL   Nitrite NEGATIVE NEGATIVE   Leukocytes, UA NEGATIVE NEGATIVE  Urine  microscopic-add on     Status: Abnormal   Collection Time: 11/19/15 10:53 PM  Result Value Ref Range   Squamous Epithelial / LPF 0-5 (A) NONE SEEN   WBC, UA 0-5 0 - 5 WBC/hpf   RBC / HPF 0-5 0 - 5 RBC/hpf   Bacteria, UA FEW (A) NONE SEEN     Imaging Review No results found. I have personally reviewed and evaluated the lab results as part of my medical decision-making.   MDM  54 y.o. male with n/v/d s/p eating under cooked chicken tonight at 7 pm. Stable for d/c after treatment for n/v. No fever and does not appear ill. Discussed with the patient plan of care and all questioned fully answered. He will return if any problems arise.  Final diagnoses:  Nausea vomiting and diarrhea        [facility] Hospital[facility] M Asharia Lotter, NP 11/20/15 0122  Glynn OctaveStephen Rancour, MD 11/20/15 [id number]

## 2015-11-20 MED ORDER — ONDANSETRON HCL 4 MG PO TABS: 4.0000 mg | ORAL_TABLET | Freq: Four times a day (QID) | ORAL | Status: AC

## 2015-11-20 NOTE — ED Notes (Signed)
Patient verbalizes understanding of discharge instructions, prescription medications, home care and follow up care. Patient ambulatory out of department at this time with family. 

## 2015-11-20 NOTE — Discharge Instructions (Signed)
Stay on clear liquids until tomorrow and then advance to the diarrhea diet. Return if symptoms worsen. Take the medication for nausea as needed.

## 2017-09-10 ENCOUNTER — Encounter (HOSPITAL_COMMUNITY): Payer: Self-pay | Admitting: Emergency Medicine

## 2017-09-10 ENCOUNTER — Emergency Department (HOSPITAL_COMMUNITY)
Admission: EM | Admit: 2017-09-10 | Discharge: 2017-09-10 | Disposition: A | Discharge status: Home / Self Care | Payer: 59 | Attending: Emergency Medicine | Admitting: Emergency Medicine

## 2017-09-10 DIAGNOSIS — M545 Low back pain: Secondary | ICD-10-CM | POA: Insufficient documentation

## 2017-09-10 DIAGNOSIS — M2560 Stiffness of unspecified joint, not elsewhere classified: Secondary | ICD-10-CM | POA: Insufficient documentation

## 2017-09-10 DIAGNOSIS — Z87891 Personal history of nicotine dependence: Secondary | ICD-10-CM | POA: Insufficient documentation

## 2017-09-10 DIAGNOSIS — S199XXA Unspecified injury of neck, initial encounter: Secondary | ICD-10-CM | POA: Diagnosis present

## 2017-09-10 DIAGNOSIS — Y9389 Activity, other specified: Secondary | ICD-10-CM | POA: Insufficient documentation

## 2017-09-10 DIAGNOSIS — Y998 Other external cause status: Secondary | ICD-10-CM | POA: Insufficient documentation

## 2017-09-10 DIAGNOSIS — M436 Torticollis: Secondary | ICD-10-CM | POA: Diagnosis not present

## 2017-09-10 DIAGNOSIS — Z79899 Other long term (current) drug therapy: Secondary | ICD-10-CM | POA: Diagnosis not present

## 2017-09-10 DIAGNOSIS — Y9289 Other specified places as the place of occurrence of the external cause: Secondary | ICD-10-CM | POA: Diagnosis not present

## 2017-09-10 MED ORDER — CYCLOBENZAPRINE HCL 10 MG PO TABS
10.0000 mg | ORAL_TABLET | Freq: Three times a day (TID) | ORAL | 0 refills | Status: DC | PRN
Start: 2017-09-10 — End: 2021-09-09

## 2017-09-10 MED ORDER — IBUPROFEN 600 MG PO TABS: 600.0000 mg | ORAL_TABLET | Freq: Four times a day (QID) | ORAL | 0 refills | Status: AC | PRN
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
## 2017-09-10 NOTE — ED Notes (Signed)
Signature pad not working  Pt tried to sign  Report made to IT
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
## 2017-09-10 NOTE — Discharge Instructions (Signed)
Apply ice packs on/off to your neck and back.  Follow-up with your primary doctor this week for recheck.  Return here if needed.
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
## 2018-12-18 ENCOUNTER — Ambulatory Visit: Payer: 59 | Admitting: Urology

## 2018-12-18 ENCOUNTER — Other Ambulatory Visit (HOSPITAL_COMMUNITY)
Admission: RE | Admit: 2018-12-18 | Discharge: 2018-12-18 | Disposition: A | Discharge status: Home / Self Care | Payer: 59 | Source: Ambulatory Visit | Attending: Urology | Admitting: Urology

## 2018-12-18 DIAGNOSIS — R361 Hematospermia: Secondary | ICD-10-CM | POA: Diagnosis present

## 2018-12-18 LAB — URINALYSIS, COMPLETE (UACMP) WITH MICROSCOPIC
Bacteria, UA: NONE SEEN
Bilirubin Urine: NEGATIVE
GLUCOSE, UA: NEGATIVE mg/dL
Ketones, ur: NEGATIVE mg/dL
Leukocytes, UA: NEGATIVE
Nitrite: NEGATIVE
Protein, ur: NEGATIVE mg/dL
SPECIFIC GRAVITY, URINE: 1.025 (ref 1.005–1.030)
pH: 5 (ref 5.0–8.0)

## 2020-02-28 ENCOUNTER — Encounter (HOSPITAL_COMMUNITY): Payer: Self-pay | Admitting: *Deleted

## 2020-02-28 ENCOUNTER — Other Ambulatory Visit: Payer: Self-pay

## 2020-02-28 ENCOUNTER — Emergency Department (HOSPITAL_COMMUNITY)
Admission: EM | Admit: 2020-02-28 | Discharge: 2020-02-28 | Disposition: A | Discharge status: Home / Self Care | Payer: 59 | Attending: Emergency Medicine | Admitting: Emergency Medicine

## 2020-02-28 DIAGNOSIS — M79602 Pain in left arm: Secondary | ICD-10-CM | POA: Diagnosis present

## 2020-02-28 DIAGNOSIS — Z87891 Personal history of nicotine dependence: Secondary | ICD-10-CM | POA: Diagnosis not present

## 2020-02-28 DIAGNOSIS — R590 Localized enlarged lymph nodes: Secondary | ICD-10-CM | POA: Insufficient documentation

## 2020-02-28 DIAGNOSIS — R591 Generalized enlarged lymph nodes: Secondary | ICD-10-CM

## 2020-02-28 NOTE — ED Notes (Signed)
KS,PA in to assess 

## 2020-02-28 NOTE — ED Provider Notes (Addendum)
[facility] EMERGENCY DEPARTMENT Provider Note   CSN: [id number] Arrival date & time: 02/28/20  1558     History Chief Complaint  Patient presents with  . Arm Pain    left    Brandon Davies is a 59 y.o. male.  Pt complains of swelling to his left arm and under his arm.  Pt states he had his second covid moderna shot.  Pt reports his arm is aching.   The history is provided by the patient. No language interpreter was used.  Arm Pain This is a new problem. The problem occurs constantly. The problem has not changed since onset.Nothing aggravates the symptoms. Nothing relieves the symptoms. He has tried nothing for the symptoms. The treatment provided no relief.       Past Medical History:  Diagnosis Date  . GERD (gastroesophageal reflux disease)     Patient Active Problem List   Diagnosis Date Noted  . Routine general medical examination at a health care facility 05/13/2014  . Tobacco use disorder 02/19/2013  . Family history of diabetes mellitus 09/05/2011    Past Surgical History:  Procedure Laterality Date  . COLONOSCOPY N/A 03/22/2013   Procedure: COLONOSCOPY;  Surgeon: Corbin Ade, MD;  Location: [facility];  Service: Endoscopy;  Laterality: N/A;  7:30 AM  . HERNIA REPAIR     inguinal       Family History  Problem Relation Age of Onset  . Hypertension Mother   . Diabetes Father   . Cancer Sister   . Mental illness Sister   . Hypertension Brother     Social History   Tobacco Use  . Smoking status: Former Smoker    Packs/day: 0.10    Years: 7.00    Pack years: 0.70    Types: Cigarettes  . Smokeless tobacco: Never Used  Substance Use Topics  . Alcohol use: Yes    Comment: occasionally  . Drug use: Yes    Types: Marijuana    Home Medications Prior to Admission medications   Medication Sig Start Date End Date Taking? Authorizing Provider  acyclovir (ZOVIRAX) 400 MG tablet Take 1 tablet (400 mg total) by mouth 3 (three) times daily. Patient  not taking: Reported on 11/19/2015 04/24/15   Gilda Crease, MD  ciprofloxacin (CIPRO) 500 MG tablet Take 1 tablet (500 mg total) by mouth 2 (two) times daily. Patient not taking: Reported on 11/19/2015 05/23/15   Tilden Fossa, MD  cyclobenzaprine (FLEXERIL) 10 MG tablet Take 1 tablet (10 mg total) by mouth 3 (three) times daily as needed. 09/10/17   Triplett, Tammy, PA-C  doxycycline (VIBRAMYCIN) 100 MG capsule Take 1 capsule (100 mg total) by mouth 2 (two) times daily. Patient not taking: Reported on 11/19/2015 04/24/15   Gilda Crease, MD  ibuprofen (ADVIL,MOTRIN) 600 MG tablet Take 1 tablet (600 mg total) by mouth every 6 (six) hours as needed. Take with food 09/10/17   Triplett, Tammy, PA-C  ondansetron (ZOFRAN) 4 MG tablet Take 1 tablet (4 mg total) by mouth every 6 (six) hours. 11/20/15   Janne Napoleon, NP    Allergies    Penicillins  Review of Systems   Review of Systems  Musculoskeletal: Positive for arthralgias.  All other systems reviewed and are negative.   Physical Exam Updated Vital Signs BP (!) 142/84 (BP Location: Right Arm)   Pulse 92   Temp 98.1 F (36.7 C) (Oral)   Resp 14   Ht 5\' 8"  (1.727 m)  Wt 68 kg   SpO2 100%   BMI 22.81 kg/m   Physical Exam Vitals reviewed.  Constitutional:      Appearance: Normal appearance.  Musculoskeletal:        General: Swelling present.     Comments: Swollen red area at injection site.  Slight swelling axilla,  No infection,    Skin:    General: Skin is warm.  Neurological:     General: No focal deficit present.     Mental Status: He is alert.  Psychiatric:        Mood and Affect: Mood normal.     ED Results / Procedures / Treatments   Labs (all labs ordered are listed, but only abnormal results are displayed) Labs Reviewed - No data to display  EKG None  Radiology No results found.  Procedures Procedures (including critical care time)  Medications Ordered in ED Medications - No data  to display  ED Course  I have reviewed the triage vital signs and the nursing notes.  Pertinent labs & imaging results that were available during my care of the patient were reviewed by me and considered in my medical decision making (see chart for details).    MDM Rules/Calculators/A&P                      MDM:  Pt counseled on vaccine reactions.  Pt has a localized  Final Clinical Impression(s) / ED Diagnoses Final diagnoses:  Lymphadenopathy    Rx / DC Orders ED Discharge Orders    None    An After Visit Summary was printed and given to the patient.    Fransico Meadow, PA-C 02/28/20 1651    Fransico Meadow, PA-C 02/28/20 1653    Milton Ferguson, MD 02/28/20 2231

## 2020-02-28 NOTE — Discharge Instructions (Signed)
Ice to area of swelling.  Ibuprofen or tylenol for discomfort. Swollen glands can last several weeks after receiving vaccination

## 2020-02-28 NOTE — ED Notes (Signed)
Pt reports pain and a lump where he received his second covid shot   moderna  Strong odor of ?marijuana  On pt clothing

## 2020-02-28 NOTE — ED Triage Notes (Signed)
Patient received the second Moderna vaccine on Wednesday.  Patient feels pain in the arm and states there is a lump where the vaccine had been given.  No other symptoms reported.

## 2021-09-09 ENCOUNTER — Emergency Department (HOSPITAL_COMMUNITY): Payer: 59

## 2021-09-09 ENCOUNTER — Emergency Department (HOSPITAL_COMMUNITY)
Admission: EM | Admit: 2021-09-09 | Discharge: 2021-09-09 | Disposition: A | Discharge status: Home / Self Care | Payer: 59 | Attending: Emergency Medicine | Admitting: Emergency Medicine

## 2021-09-09 ENCOUNTER — Other Ambulatory Visit: Payer: Self-pay

## 2021-09-09 ENCOUNTER — Encounter (HOSPITAL_COMMUNITY): Payer: Self-pay | Admitting: *Deleted

## 2021-09-09 DIAGNOSIS — Z87891 Personal history of nicotine dependence: Secondary | ICD-10-CM | POA: Insufficient documentation

## 2021-09-09 DIAGNOSIS — M545 Low back pain, unspecified: Secondary | ICD-10-CM | POA: Insufficient documentation

## 2021-09-09 DIAGNOSIS — Y92410 Unspecified street and highway as the place of occurrence of the external cause: Secondary | ICD-10-CM | POA: Diagnosis not present

## 2021-09-09 DIAGNOSIS — M25551 Pain in right hip: Secondary | ICD-10-CM | POA: Insufficient documentation

## 2021-09-09 MED ORDER — IBUPROFEN 800 MG PO TABS
800.0000 mg | ORAL_TABLET | Freq: Once | ORAL | Status: AC
  Administered 2021-09-09: 800 mg via ORAL
  Filled 2021-09-09: qty 1

## 2021-09-09 MED ORDER — CYCLOBENZAPRINE HCL 10 MG PO TABS: 10.0000 mg | ORAL_TABLET | Freq: Two times a day (BID) | ORAL | 0 refills | Status: AC | PRN

## 2021-09-09 MED ORDER — CYCLOBENZAPRINE HCL 10 MG PO TABS
10.0000 mg | ORAL_TABLET | Freq: Once | ORAL | Status: AC
  Administered 2021-09-09: 10 mg via ORAL
  Filled 2021-09-09: qty 1
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
## 2021-09-09 NOTE — Discharge Instructions (Signed)
Suspect you have a strain in your lower back and hip, recommend continuing with over-the-counter pain medications, starting you on a muscle laxer please take as prescribed.  Recommend applying heat to the area and stretch out the muscles as well decrease inflammation and swelling.  Please follow with your PCP for further evaluation.  Come back to the emergency department if you develop chest pain, shortness of breath, severe abdominal pain, uncontrolled nausea, vomiting, diarrhea.
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
## 2021-09-28 ENCOUNTER — Ambulatory Visit: Payer: 59 | Attending: Emergency Medicine

## 2021-09-28 ENCOUNTER — Other Ambulatory Visit: Payer: Self-pay

## 2021-09-28 DIAGNOSIS — R262 Difficulty in walking, not elsewhere classified: Secondary | ICD-10-CM | POA: Insufficient documentation

## 2021-09-28 DIAGNOSIS — M5416 Radiculopathy, lumbar region: Secondary | ICD-10-CM | POA: Diagnosis present

## 2021-09-28 DIAGNOSIS — M6281 Muscle weakness (generalized): Secondary | ICD-10-CM | POA: Diagnosis present
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
## 2021-09-30 ENCOUNTER — Other Ambulatory Visit: Payer: Self-pay

## 2021-09-30 ENCOUNTER — Ambulatory Visit: Payer: 59

## 2021-09-30 DIAGNOSIS — M5416 Radiculopathy, lumbar region: Secondary | ICD-10-CM | POA: Diagnosis not present

## 2021-09-30 DIAGNOSIS — R262 Difficulty in walking, not elsewhere classified: Secondary | ICD-10-CM

## 2021-09-30 DIAGNOSIS — M6281 Muscle weakness (generalized): Secondary | ICD-10-CM
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
## 2021-10-05 ENCOUNTER — Ambulatory Visit: Payer: 59

## 2021-10-05 ENCOUNTER — Other Ambulatory Visit: Payer: Self-pay

## 2021-10-05 DIAGNOSIS — M5416 Radiculopathy, lumbar region: Secondary | ICD-10-CM

## 2021-10-05 DIAGNOSIS — M6281 Muscle weakness (generalized): Secondary | ICD-10-CM
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
## 2021-10-12 ENCOUNTER — Ambulatory Visit: Payer: 59

## 2021-10-14 ENCOUNTER — Ambulatory Visit: Payer: 59

## 2021-10-14 ENCOUNTER — Other Ambulatory Visit: Payer: Self-pay

## 2021-10-14 DIAGNOSIS — M6281 Muscle weakness (generalized): Secondary | ICD-10-CM

## 2021-10-14 DIAGNOSIS — M5416 Radiculopathy, lumbar region: Secondary | ICD-10-CM | POA: Diagnosis not present

## 2021-10-14 DIAGNOSIS — R262 Difficulty in walking, not elsewhere classified: Secondary | ICD-10-CM
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
## 2021-10-19 ENCOUNTER — Ambulatory Visit: Payer: 59

## 2021-10-19 ENCOUNTER — Other Ambulatory Visit: Payer: Self-pay

## 2021-10-19 DIAGNOSIS — M5416 Radiculopathy, lumbar region: Secondary | ICD-10-CM | POA: Diagnosis not present

## 2021-10-19 DIAGNOSIS — R262 Difficulty in walking, not elsewhere classified: Secondary | ICD-10-CM

## 2021-10-19 DIAGNOSIS — M6281 Muscle weakness (generalized): Secondary | ICD-10-CM
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
## 2021-10-26 ENCOUNTER — Ambulatory Visit: Payer: 59

## 2021-10-28 ENCOUNTER — Ambulatory Visit: Payer: 59 | Attending: Emergency Medicine

## 2021-10-28 ENCOUNTER — Other Ambulatory Visit: Payer: Self-pay

## 2021-10-28 DIAGNOSIS — R262 Difficulty in walking, not elsewhere classified: Secondary | ICD-10-CM | POA: Insufficient documentation

## 2021-10-28 DIAGNOSIS — M6281 Muscle weakness (generalized): Secondary | ICD-10-CM | POA: Diagnosis not present

## 2021-10-28 DIAGNOSIS — M5416 Radiculopathy, lumbar region: Secondary | ICD-10-CM | POA: Diagnosis present
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
## 2021-11-02 ENCOUNTER — Ambulatory Visit: Payer: 59

## 2021-11-02 NOTE — Patient Instructions (Incomplete)
TREATMENT     Ther-ex  Prone straight knee extension x 10 BLE; Prone HS curls with manual resistance from therapist x 10 BLE; Hooklying lumbothoracic rotation knee rocks 10 each direction; Hooklying anterior/posterior pelvic tilts 5s hold x 10 each direction; Hooklying SLR with posterior pelvic tilt x 10 BLE; Hooklying bridges 2 x 10; Hooklying clams with manual resistance 2 x 10; Hooklying adductor squeezes with manual resistance 2 x 10;     Electrical Stimulation  Prone positioning with moist heat pack on lumbar spine. Utilized Empi Continnum unit with default back TENS settings with modulation. Two channel crossed pattern at pt tolerated intensity without muscle contraction (20 bilaterally). Performed x 10 minutes at start of session. No skin irritation noted once pads removed.       Pt educated throughout session about proper posture and technique with exercises. Improved exercise technique, movement at target joints, use of target muscles after min to mod verbal, visual, tactile cues.      Repeated electrical stimulation today during prone positioning to manage pain. Avoided lumbar passive accessory mobilizations again today as pt reported significant soreness previously when performed. Continued with gentle active range of motion within a very limited range to improve motion in the lumbar spine and decrease pain. Continued to progress light strengthening exercises for lumbar and abdominal stabilization. Overall pt reports less pain during movement today and states that he continues to improve. Patient encouraged to continue HEP and follow-up scheduled.  Pt will benefit from PT services to address deficits in pain, range of motion, and strength in order to return to full function at home and work.

## 2021-11-04 ENCOUNTER — Ambulatory Visit: Payer: 59

## 2021-11-09 ENCOUNTER — Ambulatory Visit: Payer: 59

## 2021-11-11 ENCOUNTER — Ambulatory Visit: Payer: 59

## 2021-11-16 ENCOUNTER — Ambulatory Visit: Payer: 59

## 2021-11-18 ENCOUNTER — Ambulatory Visit: Payer: 59

## 2023-02-06 ENCOUNTER — Encounter: Payer: Self-pay | Admitting: *Deleted
